# Patient Record
Sex: Female | Born: 1960 | Race: Black or African American | Hispanic: No | Smoking: Never smoker
Health system: Southern US, Community
[De-identification: ages and names within clinical notes are randomized; demographics above are authoritative.]

## PROBLEM LIST (undated history)

## (undated) DIAGNOSIS — E119 Type 2 diabetes mellitus without complications: Secondary | ICD-10-CM

## (undated) DIAGNOSIS — Z789 Other specified health status: Secondary | ICD-10-CM

## (undated) DIAGNOSIS — I10 Essential (primary) hypertension: Secondary | ICD-10-CM

## (undated) HISTORY — DX: Essential (primary) hypertension: I10

## (undated) HISTORY — DX: Type 2 diabetes mellitus without complications: E11.9

## (undated) HISTORY — PX: WISDOM TOOTH EXTRACTION: SHX21

## (undated) HISTORY — PX: DILATION AND CURETTAGE OF UTERUS: SHX78

---

## 1987-06-21 HISTORY — PX: TUBAL LIGATION: SHX77

## 1999-06-02 ENCOUNTER — Other Ambulatory Visit: Admission: RE | Admit: 1999-06-02 | Discharge: 1999-06-02 | Payer: Self-pay | Admitting: *Deleted

## 2000-06-28 ENCOUNTER — Other Ambulatory Visit: Admission: RE | Admit: 2000-06-28 | Discharge: 2000-06-28 | Payer: Self-pay | Admitting: *Deleted

## 2000-06-28 ENCOUNTER — Encounter: Admission: RE | Admit: 2000-06-28 | Discharge: 2000-06-28 | Payer: Self-pay | Admitting: *Deleted

## 2003-05-20 ENCOUNTER — Other Ambulatory Visit: Admission: RE | Admit: 2003-05-20 | Discharge: 2003-05-20 | Payer: Self-pay | Admitting: Family Medicine

## 2004-07-15 ENCOUNTER — Encounter: Admission: RE | Admit: 2004-07-15 | Discharge: 2004-07-15 | Payer: Self-pay | Admitting: Family Medicine

## 2005-03-22 ENCOUNTER — Other Ambulatory Visit: Admission: RE | Admit: 2005-03-22 | Discharge: 2005-03-22 | Payer: Self-pay | Admitting: Family Medicine

## 2006-01-16 ENCOUNTER — Ambulatory Visit: Payer: Self-pay | Admitting: Family Medicine

## 2006-01-23 ENCOUNTER — Ambulatory Visit: Payer: Self-pay | Admitting: Family Medicine

## 2006-01-23 ENCOUNTER — Other Ambulatory Visit: Admission: RE | Admit: 2006-01-23 | Discharge: 2006-01-23 | Payer: Self-pay | Admitting: Family Medicine

## 2006-01-31 ENCOUNTER — Encounter: Admission: RE | Admit: 2006-01-31 | Discharge: 2006-01-31 | Payer: Self-pay | Admitting: Family Medicine

## 2006-02-07 ENCOUNTER — Ambulatory Visit: Payer: Self-pay | Admitting: Family Medicine

## 2006-02-09 ENCOUNTER — Encounter: Admission: RE | Admit: 2006-02-09 | Discharge: 2006-02-09 | Payer: Self-pay | Admitting: Family Medicine

## 2006-04-04 ENCOUNTER — Ambulatory Visit: Payer: Self-pay | Admitting: Family Medicine

## 2006-06-23 ENCOUNTER — Ambulatory Visit (HOSPITAL_COMMUNITY): Admission: RE | Admit: 2006-06-23 | Discharge: 2006-06-23 | Payer: Self-pay | Admitting: Obstetrics and Gynecology

## 2006-06-23 ENCOUNTER — Encounter (INDEPENDENT_AMBULATORY_CARE_PROVIDER_SITE_OTHER): Payer: Self-pay | Admitting: Specialist

## 2008-04-03 IMAGING — MG MM SCREEN MAMMOGRAM BILATERAL
4 series · 4 of 4 positions shown · non-contrast
Comparison: none

DG SCREEN MAMMOGRAM BILATERAL
Bilateral CC and MLO view(s) were taken.

SCREENING MAMMOGRAM:
There is a fibroglandular pattern.  No masses or malignant type calcifications are identified.  
Compared with prior studies.

[R CC]
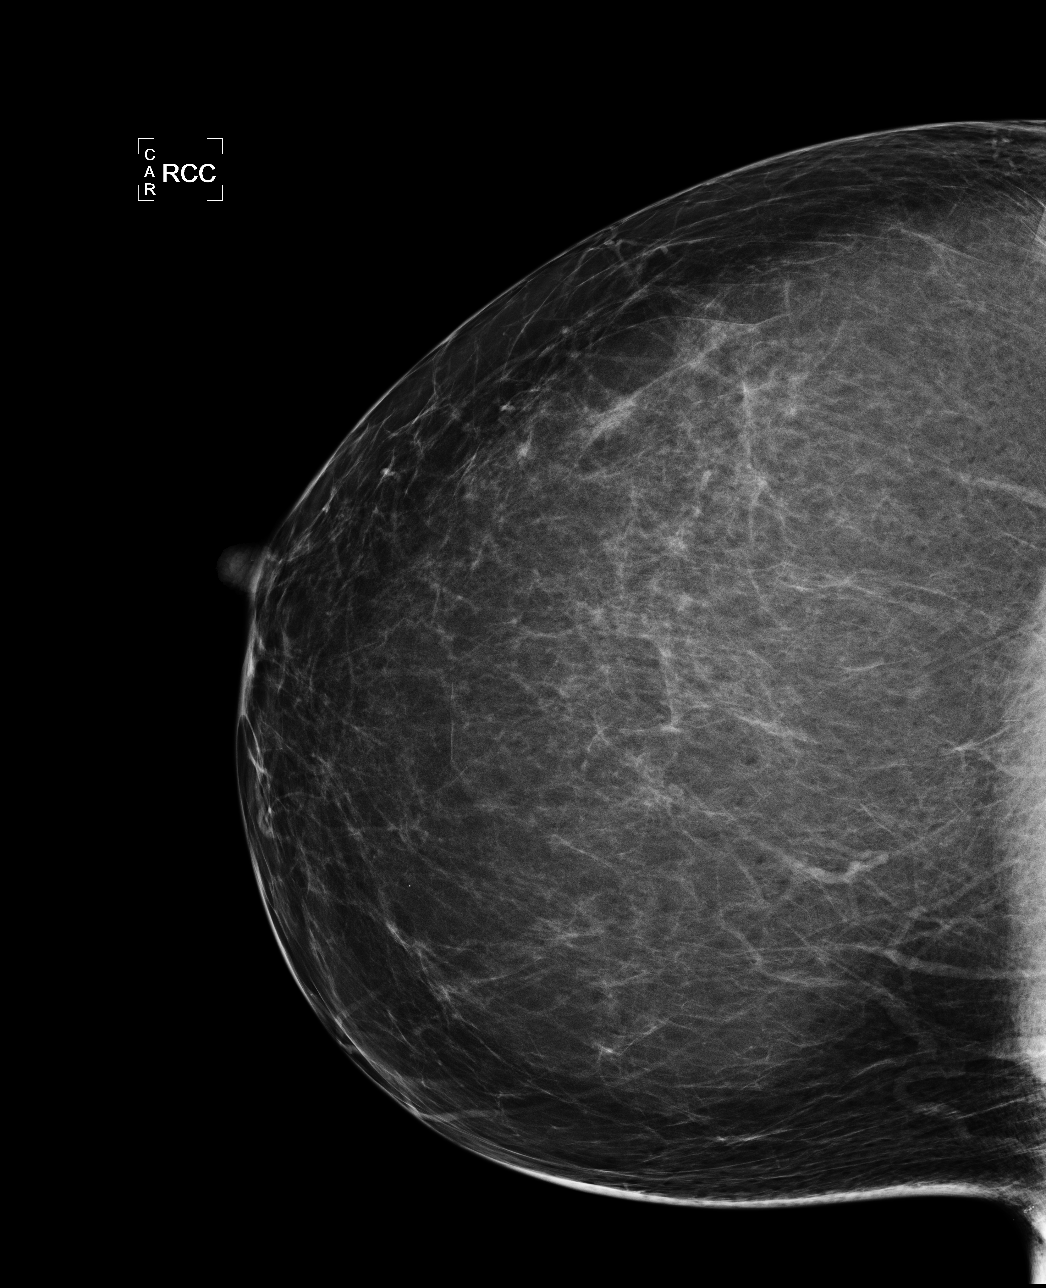

[L CC]
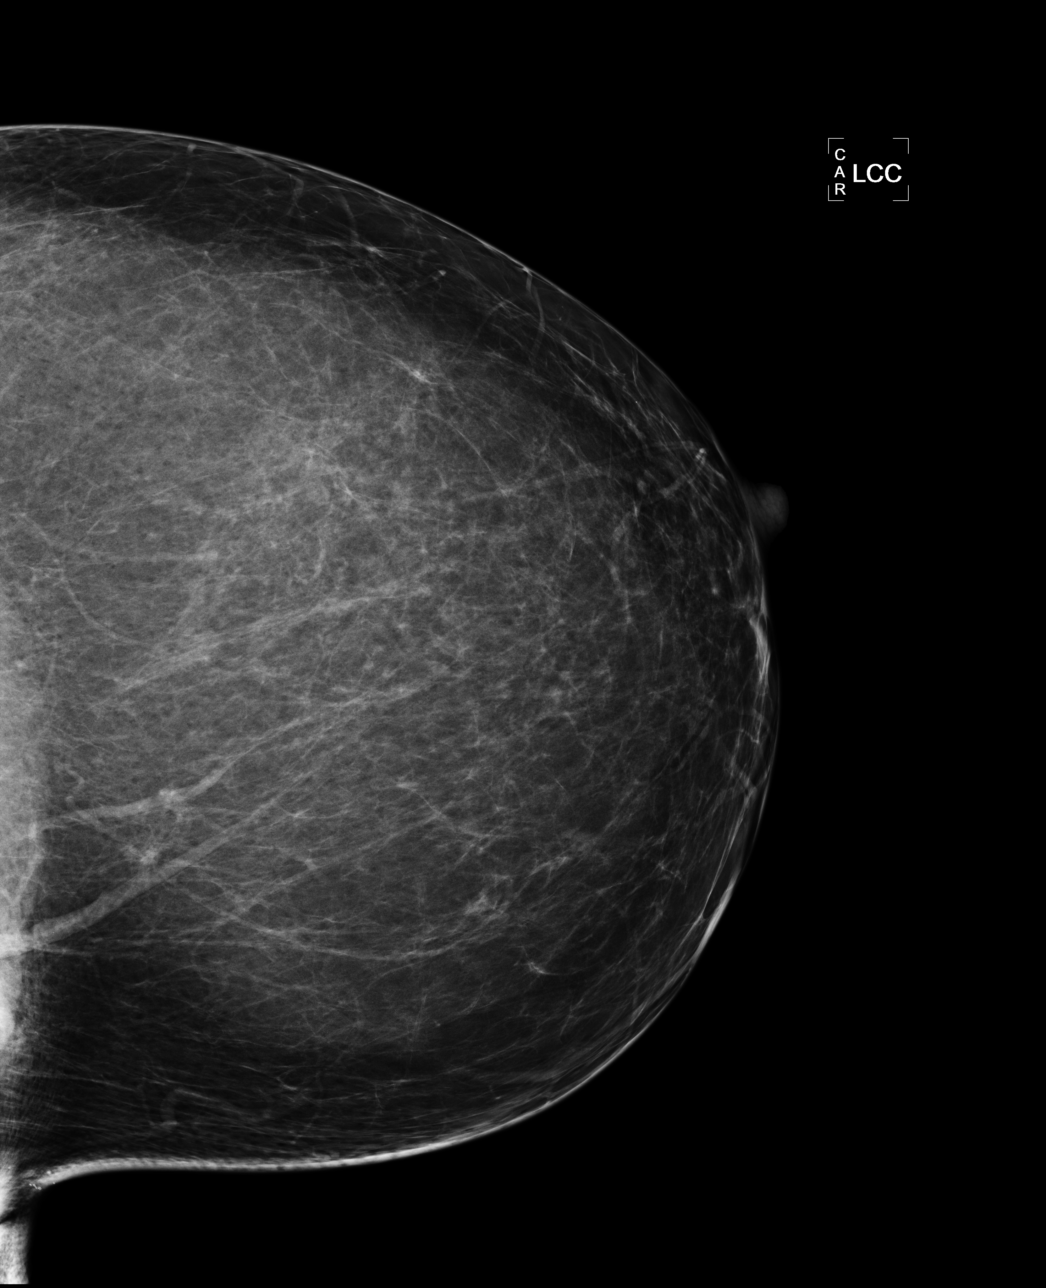

[L MLO]
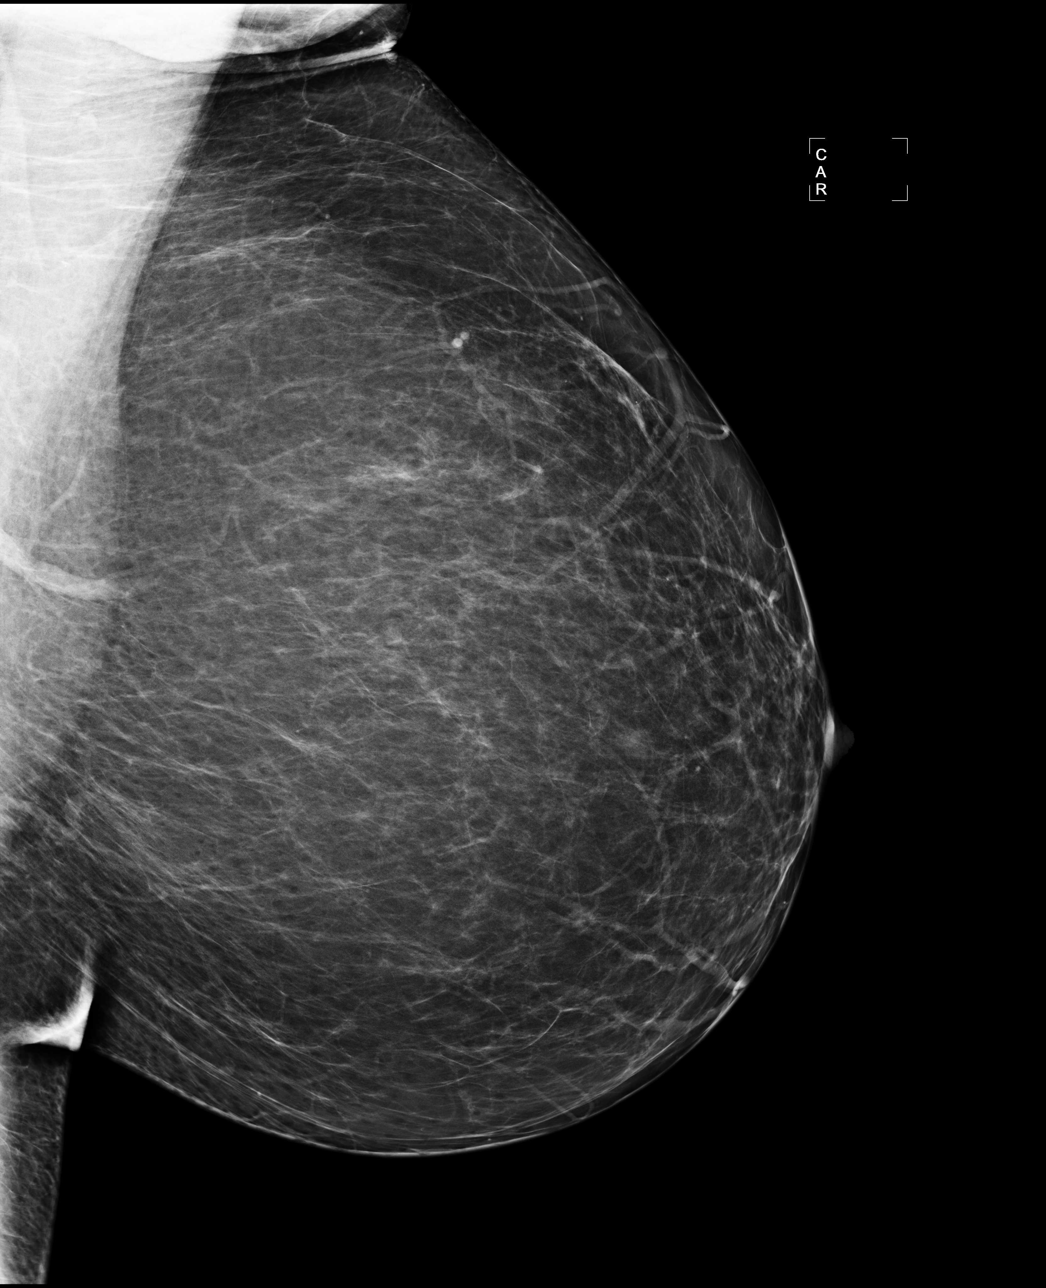

[R MLO]
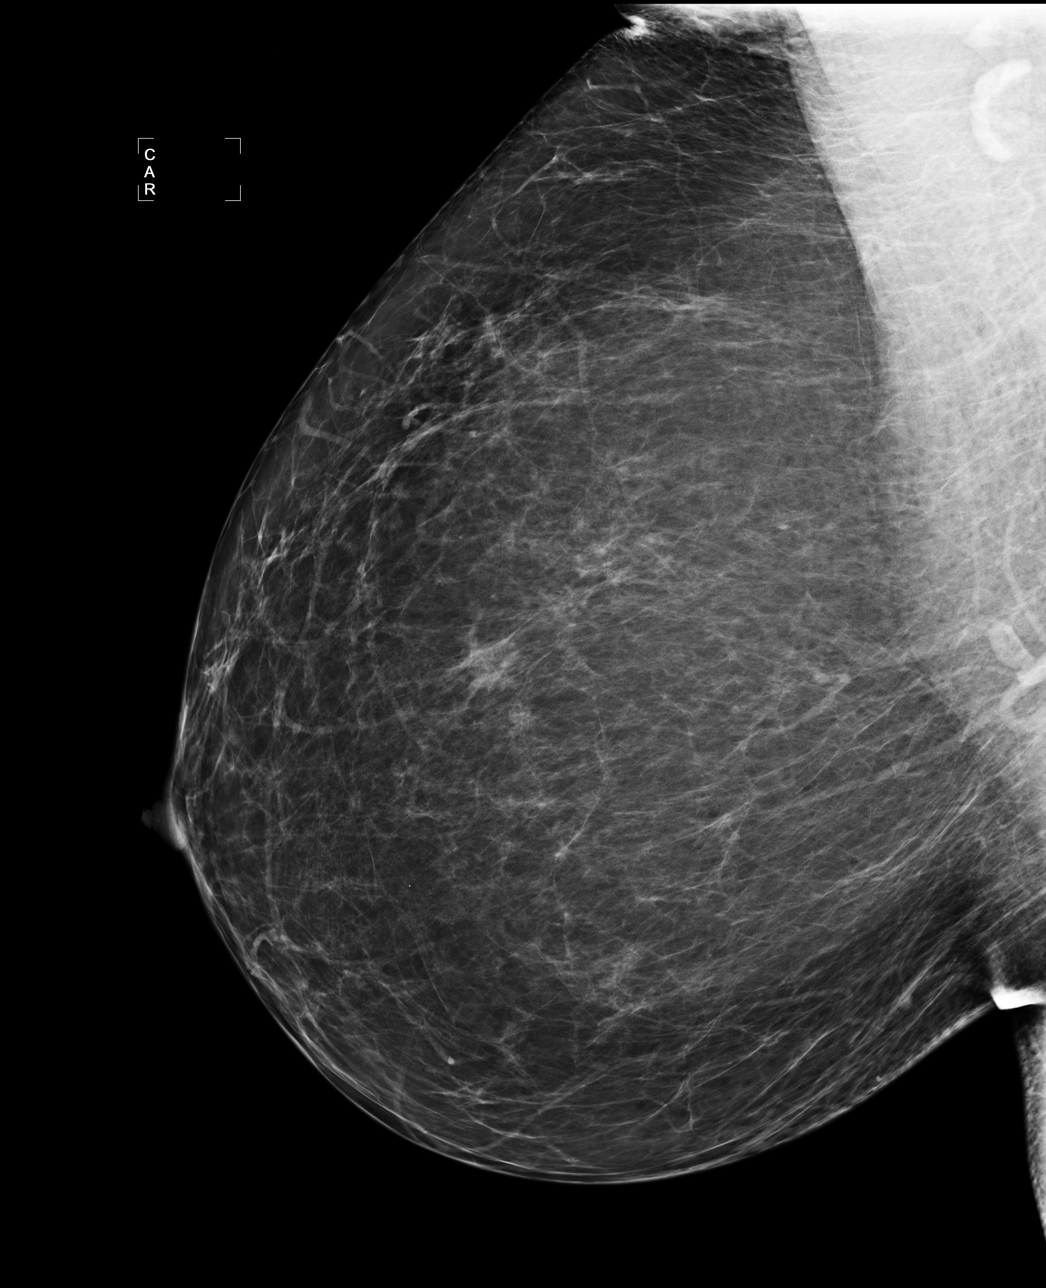

[4 of 4 positions shown; findings below may reference images not displayed]

IMPRESSION: No specific mammographic evidence of malignancy.  Next screening mammogram is recommended in one 
year.

ASSESSMENT: Negative - BI-RADS 1

Screening mammogram in 1 year.
ANALYZED BY COMPUTER AIDED DETECTION. , THIS PROCEDURE WAS A DIGITAL MAMMOGRAM.

## 2008-04-12 IMAGING — US US PELVIS COMPLETE
1 series · 13 of 25 positions shown · non-contrast
Comparison: None.

TRANSABDOMINAL AND TRANSVAGINAL PELVIC ULTRASOUND:

CLINICAL DATA: Cyst felt on physical exam. Abnormal Pap smear.
TECHNIQUE: Both transabdominal and transvaginal ultrasound examinations of the
pelvis were performed including evaluation of the uterus, ovaries, adnexal
regions, and pelvic cul-de-sac.

[Series 1: us pelvis complete · 0.35mm/px · 13 of 84 slices shown]
[im 1/84]
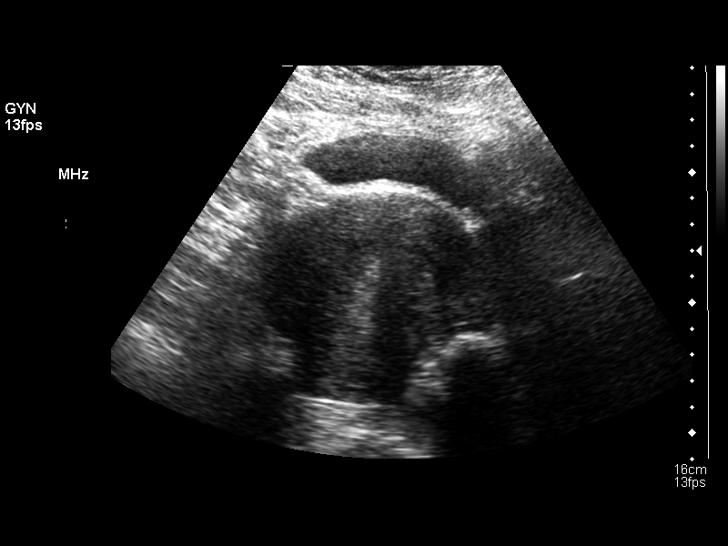
[im 7/84]
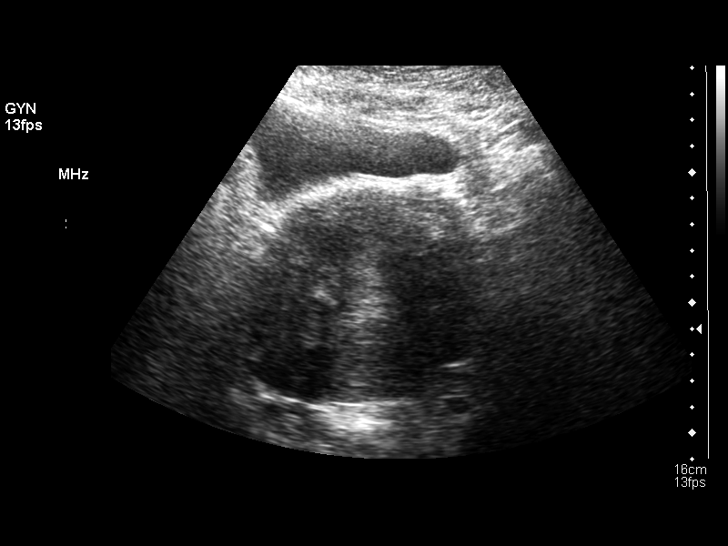
[im 14/84]
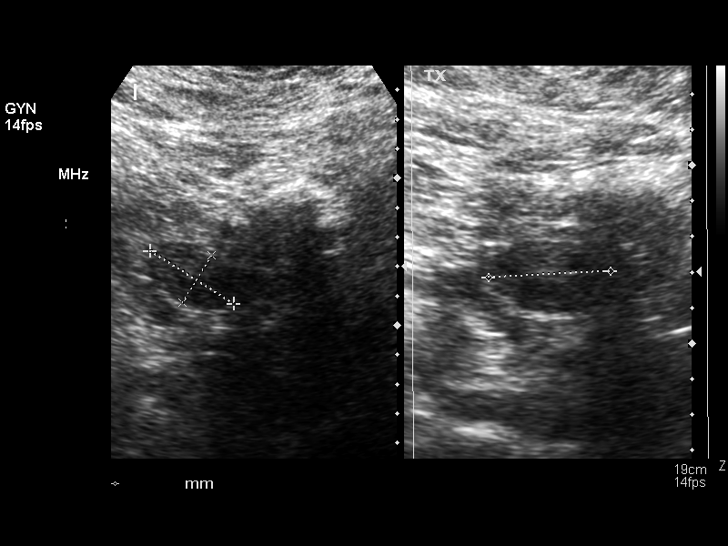
[im 21/84]
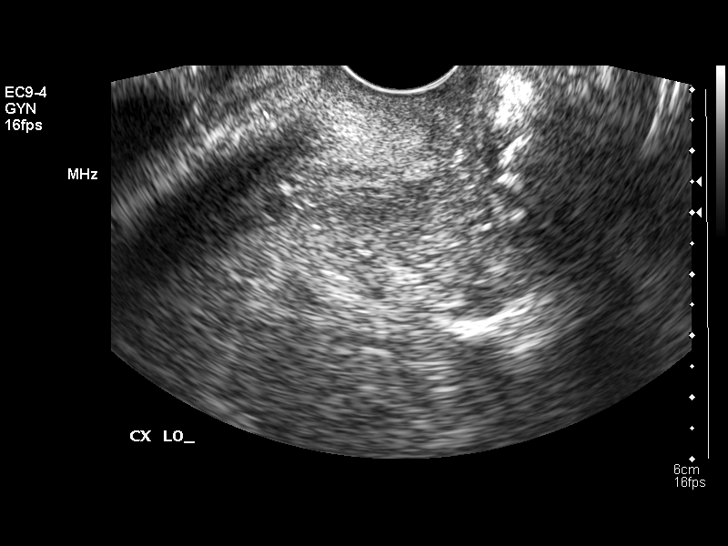
[im 28/84]
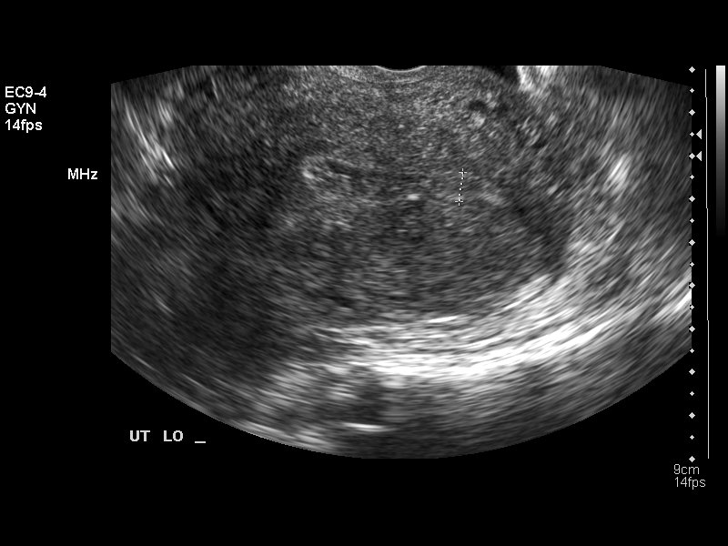
[im 35/84]
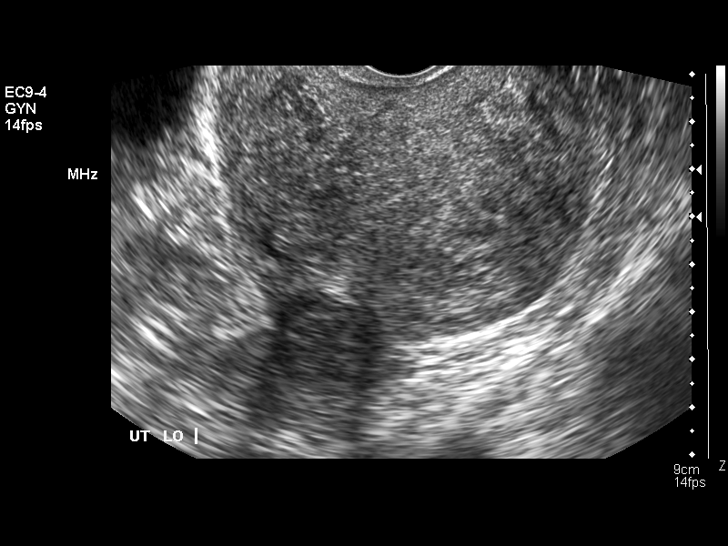
[im 42/84]
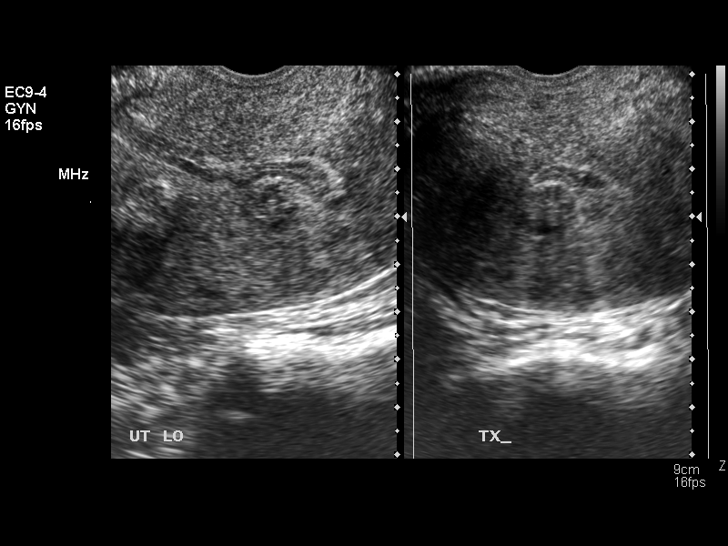
[im 49/84]
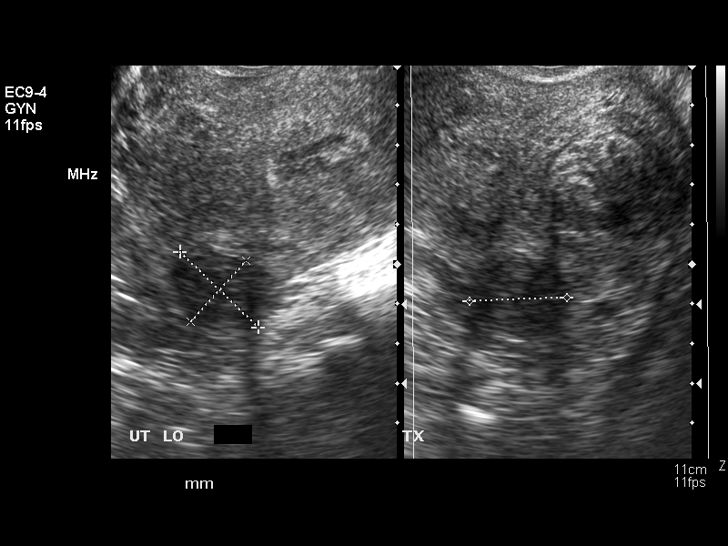
[im 56/84]
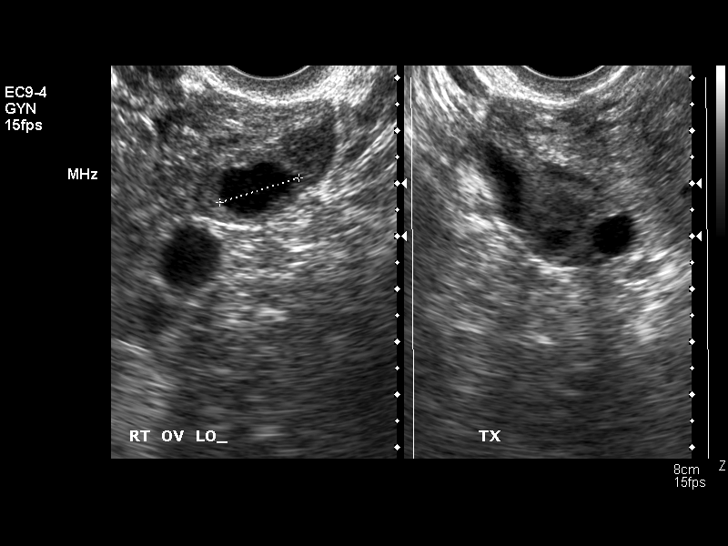
[im 63/84]
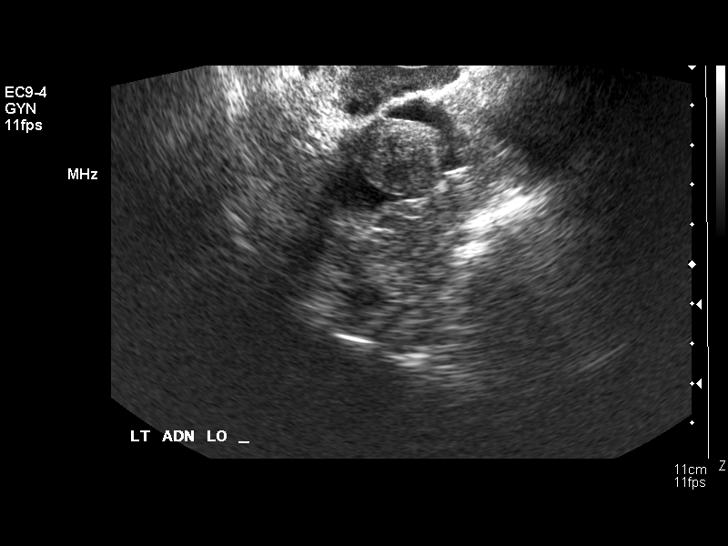
[im 70/84]
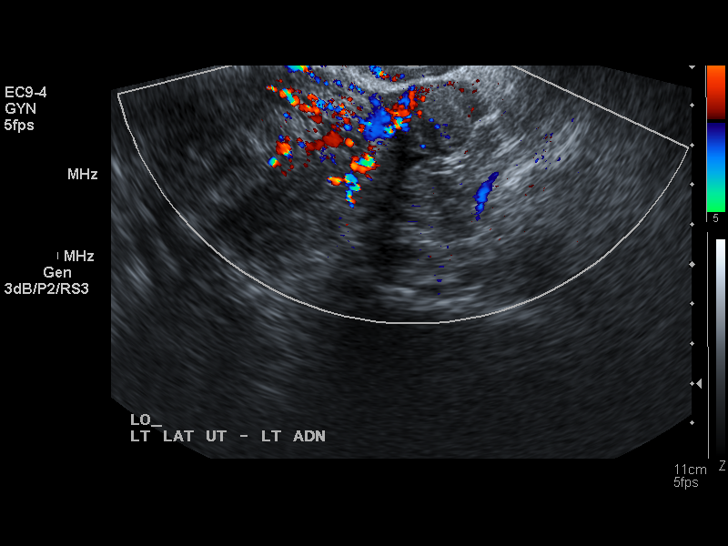
[im 77/84]
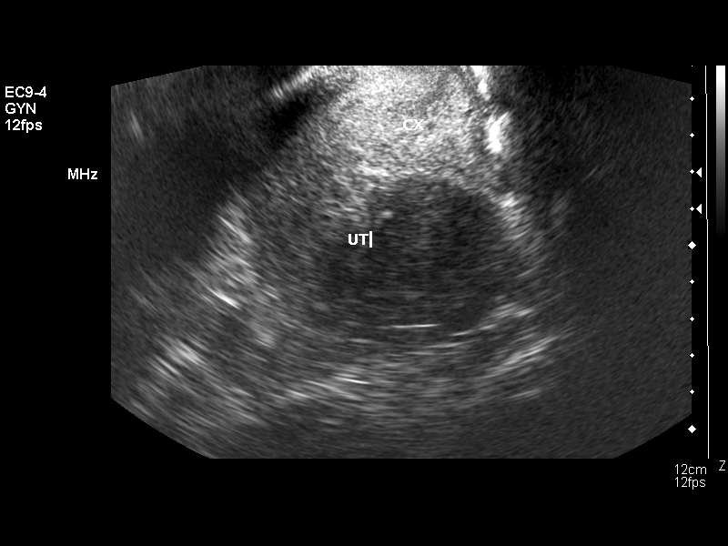
[im 84/84]
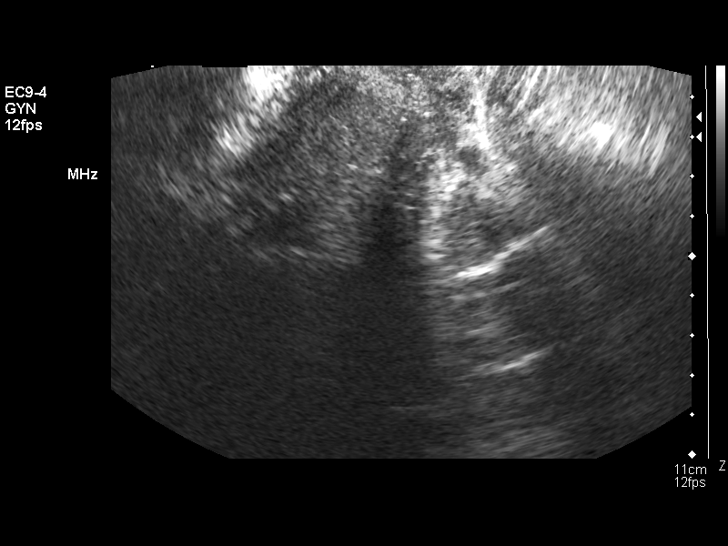

[13 of 25 positions shown; findings below may reference images not displayed]

FINDINGS: The uterus is retroflexed and measures approximately 20 x 5.3 x
cm. Two fibroids are identified, one in the left mid uterus is intramural and
measures 3.5 x 3.4 cm. A right subserosal fibroid measures 3.7 x 2.9 cm.
Endometrial stripe is 6-7 mm in thickness. A small focal area of increased
echogenicity is identified in the fundal endometrium and may represent a small
submucosal fibroid or endometrial polyp.

The right ovary measures 4.3 x 2. 1 x 2.4 cm. The left ovary is not well seen. A
2.3 x 2.0 cm noncystic area is identified in the left adnexal space which is
contiguous with the uterus. This may represent a small pedunculated fibroid
although bridging tissue between this area in the uterus itself is not well
demonstrated.
IMPRESSION: Multiple uterine fibroids. 6-7 mm endometrial stripe with a 1.1 cm submucosal
fibroid versus endometrial polyp. Sonohysterogram would likely prove helpful to
further evaluate.

Normal right ovary with nonvisualization of the left ovary. There is probably a
small pedunculated fibroid in the left adnexal space although the bridging
myometrial tissue is not well demonstrated. Consider ultrasound followup to
ensure stability.

## 2009-06-20 HISTORY — PX: COLONOSCOPY: SHX174

## 2010-02-03 ENCOUNTER — Ambulatory Visit (HOSPITAL_COMMUNITY): Admission: RE | Admit: 2010-02-03 | Discharge: 2010-02-03 | Payer: Self-pay | Admitting: Obstetrics and Gynecology

## 2010-09-03 LAB — CBC
HCT: 36.7 % (ref 36.0–46.0)
Hemoglobin: 12.4 g/dL (ref 12.0–15.0)
MCHC: 33.7 g/dL (ref 30.0–36.0)
MCV: 95.4 fL (ref 78.0–100.0)
RDW: 14 % (ref 11.5–15.5)

## 2010-11-05 NOTE — Op Note (Signed)
Susan, Riggs               ACCOUNT NO.:  [id number]   MEDICAL RECORD NO.:  [id number]          PATIENT TYPE:  AMB   LOCATION:  [facility]C                           FACILITY:  [facility]   PHYSICIAN:  [name], MD    DATE OF BIRTH:  [birth year]   DATE OF PROCEDURE:  06/23/2006  DATE OF DISCHARGE:                               OPERATIVE REPORT   PREOPERATIVE DIAGNOSIS:  Endometrial mass, suspect endometrial polyp.   POSTOPERATIVE DIAGNOSIS:  Endometrial mass, suspect endometrial polyp.   PROCEDURE:  Hysteroscopy D&C with polypectomy.   SURGEON:  Renaldo Fiddler.   ASSISTANT:  None.   ANESTHESIA:  General.   SPECIMEN:  Endometrial curettings and endometrial polyp.   ESTIMATED BLOOD LOSS:  Minimal.   COMPLICATIONS:  None.   SORBITOL DEFICIT:  80 mL.   CONDITION:  Stable and extubated to the recovery room.   PROCEDURE:  The patient was taken to the operating room where she was  placed under general anesthesia.  She was then positioned and dorsal  lithotomy using Allen stirrups.  She was prepped and draped in sterile  fashion, and a sterile catheter was used to empty her bladder for  approximately 25 mL of clear urine.  Due to uterine prolapse, a speculum  was not needed.  A single-tooth tenaculum was placed on the anterior lip  of the cervix, and a cervical block was done with 1% Xylocaine.  The  uterus then sounded to 9 cm and was easily dilated using Pratt dilators.  The hysteroscope was then inserted into the uterine cavity.  Her left  ostia was easily visualized.  The endometrial cavity was somewhat  atrophic.  The right ostia could not be visualized.  There was a large  endometrial polyp noted in the right fundal portion of the uterus.  The  hysteroscope was then removed, and polyp forceps were used to grasp and  remove the polyp.  A D&C was then performed.  The hysteroscope was then  reinserted to ensure that the polyp was removed.  The polyp could not be  located.  We could  still not locate the right ostia with the scope.  Due  to a high deficit at one point of 120 mL, the uterine cavity was  inspected for evidence of perforation.  This could not be  documented.  We then found fluid sitting within the sterile drape.  This  was drained into the suction cavity.  At the end of the case, our  deficit was 80 mL.  The patient tolerated the procedure well.  Sponge,  lap, needle and instrument counts were correct x2.  She was extubated  and taken to the recovery room in sterile condition.      [name], MD  Electronically Signed     GA/MEDQ  D:  06/23/2006  T:  06/23/2006  Job:  [id number]

## 2010-11-05 NOTE — H&P (Signed)
Susan Riggs               ACCOUNT NO.:  [id number]   MEDICAL RECORD NO.:  [id number]          PATIENT TYPE:  AMB   LOCATION:  SDC                           [facility]ACILITY:  [facility]   PHYSICIAN:  [name], MD    DATE OF BIRTH:  1960/11/05   DATE OF ADMISSION:  DATE OF DISCHARGE:                              HISTORY & PHYSICAL   HISTORY OF PRESENT ILLNESS:  The patient is a 50 year old African-  American female who first presented to the office in September to  discuss fibroids and a possible endometrial polyp.  A pelvic ultrasound  was ordered by her primary care physician, Dr. Susann Givens, after a possible  cyst was felt on pelvic exam. Ultrasound at [facility] showed a  left sided fibroid measuring 3.5 x 3.4 cm, a right subserosal fibroid as  well as a thickened endometrial stripe.  There was also an increase in  focal area of increased echogenicity within the endometrial cavity  possibly representing a polyp.  This was followed up with a  sonohysterogram which showed a 12 mm polypoid mass within the  endometrial cavity.   PAST MEDICAL HISTORY:  Negative.   PAST SURGICAL HISTORY:  Bilateral tubal ligation.   SOCIAL HISTORY:  Negative for tobacco, alcohol or drugs.   ALLERGIES:  PENICILLIN.   MEDICATIONS:  Multivitamins, iron.   OBSTETRICAL HISTORY:  Two full-term vaginal deliveries. GYN history is  negative for abnormal Pap smears.   FAMILY HISTORY:  Noncontributory.   PHYSICAL EXAMINATION:  VITAL SIGNS:  Height 5 feet, 6 inches, weight  224.  Blood pressure 124/80.  ABDOMEN:  Soft, obese, nontender.  No palpable masses.  PELVIC:  Exam reveals normal external female genitalia.  Vagina and  cervix are normal.  No lesions.  There is a small amount of prolapse.  Uterus is mobile and mildly tender, more so on the right.  The uterus is  enlarged consistent with fibroids.  There are no adnexal masses noted.   ASSESSMENT/PLAN:  This is a 50 year old African-American  female with a  fibroid uterus and endometrial polyps.  The patient elects to proceed  with hysteroscopy, D and C and polypectomy.  Informed consent was  obtained.      [name], MD  Electronically Signed    GA/MEDQ  D:  06/22/2006  T:  06/22/2006  Job:  [id number]

## 2011-03-15 NOTE — Patient Instructions (Addendum)
   Your procedure is scheduled on: Friday  Enter through the Main Entrance of [facility] at: 6am Pick up the phone at the desk and dial [phone] and inform us of your arrival  Please call this number if you have any problems the morning of surgery: [phone]  Remember: Do not eat food after midnight  Do not drink clear liquids after:midnight Take these medicines the morning of surgery with a SIP OF WATER:none  Do not wear jewelry, make-up, or FINGER nail polish Do not wear lotions, powders, or perfumes.  Do not shave 48 hours prior to surgery. Do not bring valuables to the hospital.  Patients discharged on the day of surgery will not be allowed to drive home.   Name and phone number of your driver: daughter Zigmund Daniel   Remember to use your hibiclens as instructed.Please shower with 1/2 bottle the evening before your surgery and the other 1/2 bottle the morning of surgery.

## 2011-03-18 ENCOUNTER — Encounter (HOSPITAL_COMMUNITY): Payer: Self-pay

## 2011-03-18 ENCOUNTER — Encounter (HOSPITAL_COMMUNITY)
Admission: RE | Admit: 2011-03-18 | Discharge: 2011-03-18 | Disposition: A | Discharge status: Home / Self Care | Payer: BC Managed Care – PPO | Source: Ambulatory Visit | Attending: Obstetrics and Gynecology | Admitting: Obstetrics and Gynecology

## 2011-03-18 HISTORY — DX: Other specified health status: Z78.9

## 2011-03-18 LAB — CBC
MCH: 27.8 pg (ref 26.0–34.0)
MCHC: 32.5 g/dL (ref 30.0–36.0)
MCV: 85.5 fL (ref 78.0–100.0)
Platelets: 206 10*3/uL (ref 150–400)

## 2011-03-24 NOTE — H&P (Addendum)
50 yo w/ menorrhagia and endometrial mass presents today for surgical mngt.    PMHx:  Neg PSHx:  H/s D&C 01/2010 All:  None Meds:  Integra, MTV FHx:  N/A  AF, VSS Gen - NAD CV - RRR Lungs - clear bilat Abd - NT PV - without masses  SHG - 3cm intracavitary mass.  Multiple uterine fibroids  A/P:  Menorrhagia - offered pt hysterectomy given h/o endometrial polyp in the past.  Pt declines and elects for hysteroscopy, D&C w/ resection of endometrial mass.  R/B/A d/w pt and informed consent obtained.  No changes, ga

## 2011-03-25 ENCOUNTER — Encounter (HOSPITAL_COMMUNITY): Payer: Self-pay | Admitting: *Deleted

## 2011-03-25 ENCOUNTER — Other Ambulatory Visit: Payer: Self-pay | Admitting: Obstetrics and Gynecology

## 2011-03-25 ENCOUNTER — Ambulatory Visit (HOSPITAL_COMMUNITY): Payer: BC Managed Care – PPO | Admitting: Anesthesiology

## 2011-03-25 ENCOUNTER — Encounter (HOSPITAL_COMMUNITY): Payer: Self-pay | Admitting: Anesthesiology

## 2011-03-25 ENCOUNTER — Ambulatory Visit (HOSPITAL_COMMUNITY)
Admission: RE | Admit: 2011-03-25 | Discharge: 2011-03-25 | Disposition: A | Discharge status: Home / Self Care | Payer: BC Managed Care – PPO | Source: Ambulatory Visit | Attending: Obstetrics and Gynecology | Admitting: Obstetrics and Gynecology

## 2011-03-25 ENCOUNTER — Encounter (HOSPITAL_COMMUNITY)
Admission: RE | Disposition: A | Discharge status: Home / Self Care | Payer: Self-pay | Source: Ambulatory Visit | Attending: Obstetrics and Gynecology

## 2011-03-25 DIAGNOSIS — Z01818 Encounter for other preprocedural examination: Secondary | ICD-10-CM | POA: Insufficient documentation

## 2011-03-25 DIAGNOSIS — N920 Excessive and frequent menstruation with regular cycle: Secondary | ICD-10-CM | POA: Insufficient documentation

## 2011-03-25 DIAGNOSIS — N840 Polyp of corpus uteri: Secondary | ICD-10-CM | POA: Insufficient documentation

## 2011-03-25 DIAGNOSIS — Z01812 Encounter for preprocedural laboratory examination: Secondary | ICD-10-CM | POA: Insufficient documentation

## 2011-03-25 HISTORY — PX: HYSTEROSCOPY WITH D & C: SHX1775

## 2011-03-25 SURGERY — DILATATION AND CURETTAGE /HYSTEROSCOPY
Anesthesia: Monitor Anesthesia Care | Site: Vagina | Wound class: Clean Contaminated

## 2011-03-25 MED ORDER — ACETAMINOPHEN 325 MG PO TABS: 325.0000 mg | ORAL_TABLET | ORAL | Status: DC | PRN

## 2011-03-25 MED ORDER — PROMETHAZINE HCL 25 MG/ML IJ SOLN: 6.2500 mg | INTRAMUSCULAR | Status: DC | PRN

## 2011-03-25 MED ORDER — FENTANYL CITRATE 0.05 MG/ML IJ SOLN
INTRAMUSCULAR | Status: AC
  Filled 2011-03-25: qty 2

## 2011-03-25 MED ORDER — DEXAMETHASONE SODIUM PHOSPHATE 4 MG/ML IJ SOLN
INTRAMUSCULAR | Status: DC | PRN
  Administered 2011-03-25: 10 mg via INTRAVENOUS

## 2011-03-25 MED ORDER — KETOROLAC TROMETHAMINE 30 MG/ML IJ SOLN
INTRAMUSCULAR | Status: DC | PRN
  Administered 2011-03-25: 30 mg via INTRAVENOUS

## 2011-03-25 MED ORDER — FENTANYL CITRATE 0.05 MG/ML IJ SOLN
INTRAMUSCULAR | Status: DC | PRN
  Administered 2011-03-25 (×4): 50 ug via INTRAVENOUS

## 2011-03-25 MED ORDER — MIDAZOLAM HCL 2 MG/2ML IJ SOLN
INTRAMUSCULAR | Status: AC
  Filled 2011-03-25: qty 2

## 2011-03-25 MED ORDER — LIDOCAINE HCL (CARDIAC) 20 MG/ML IV SOLN
INTRAVENOUS | Status: DC | PRN
  Administered 2011-03-25: 100 mg via INTRAVENOUS

## 2011-03-25 MED ORDER — GLYCOPYRROLATE 0.2 MG/ML IJ SOLN
INTRAMUSCULAR | Status: DC | PRN
  Administered 2011-03-25: .15 mg via INTRAVENOUS

## 2011-03-25 MED ORDER — GLYCINE 1.5 % IR SOLN
Status: DC | PRN
  Administered 2011-03-25: 3000 mL

## 2011-03-25 MED ORDER — DEXAMETHASONE SODIUM PHOSPHATE 10 MG/ML IJ SOLN
INTRAMUSCULAR | Status: AC
  Filled 2011-03-25: qty 1

## 2011-03-25 MED ORDER — LIDOCAINE HCL 1 % IJ SOLN
INTRAMUSCULAR | Status: DC | PRN
  Administered 2011-03-25: 10 mL

## 2011-03-25 MED ORDER — KETOROLAC TROMETHAMINE 30 MG/ML IJ SOLN: 15.0000 mg | Freq: Once | INTRAMUSCULAR | Status: DC | PRN

## 2011-03-25 MED ORDER — PROPOFOL 10 MG/ML IV EMUL
INTRAVENOUS | Status: DC | PRN
  Administered 2011-03-25: 200 mg via INTRAVENOUS

## 2011-03-25 MED ORDER — ONDANSETRON HCL 4 MG/2ML IJ SOLN
INTRAMUSCULAR | Status: AC
  Filled 2011-03-25: qty 2

## 2011-03-25 MED ORDER — ONDANSETRON HCL 4 MG/2ML IJ SOLN
INTRAMUSCULAR | Status: DC | PRN
  Administered 2011-03-25: 4 mg via INTRAVENOUS

## 2011-03-25 MED ORDER — FENTANYL CITRATE 0.05 MG/ML IJ SOLN: 25.0000 ug | INTRAMUSCULAR | Status: DC | PRN

## 2011-03-25 MED ORDER — PROPOFOL 10 MG/ML IV EMUL
INTRAVENOUS | Status: AC
  Filled 2011-03-25: qty 20

## 2011-03-25 MED ORDER — LACTATED RINGERS IV SOLN
INTRAVENOUS | Status: DC
  Administered 2011-03-25: 125 mL via INTRAVENOUS
  Administered 2011-03-25: 08:00:00 via INTRAVENOUS

## 2011-03-25 MED ORDER — HYDROCODONE-ACETAMINOPHEN 5-500 MG PO TABS: 1.0000 | ORAL_TABLET | Freq: Four times a day (QID) | ORAL | Status: AC | PRN

## 2011-03-25 MED ORDER — KETOROLAC TROMETHAMINE 30 MG/ML IJ SOLN
INTRAMUSCULAR | Status: AC
  Filled 2011-03-25: qty 1

## 2011-03-25 MED ORDER — GLYCOPYRROLATE 0.2 MG/ML IJ SOLN
INTRAMUSCULAR | Status: AC
  Filled 2011-03-25: qty 1

## 2011-03-25 MED ORDER — LIDOCAINE HCL (CARDIAC) 20 MG/ML IV SOLN
INTRAVENOUS | Status: AC
  Filled 2011-03-25: qty 5

## 2011-03-25 MED ORDER — MIDAZOLAM HCL 5 MG/5ML IJ SOLN
INTRAMUSCULAR | Status: DC | PRN
  Administered 2011-03-25: 2 mg via INTRAVENOUS

## 2011-03-25 SURGICAL SUPPLY — 18 items
ABLATOR ENDOMETRIAL BIPOLAR (ABLATOR) IMPLANT
CANISTER SUCTION 2500CC (MISCELLANEOUS) ×2 IMPLANT
CATH ROBINSON RED A/P 16FR (CATHETERS) ×2 IMPLANT
CATH THERMACHOICE III (CATHETERS) IMPLANT
CLOTH BEACON ORANGE TIMEOUT ST (SAFETY) ×2 IMPLANT
CONTAINER PREFILL 10% NBF 60ML (FORM) ×4 IMPLANT
DRAPE UTILITY XL STRL (DRAPES) ×2 IMPLANT
DRESSING TELFA 8X3 (GAUZE/BANDAGES/DRESSINGS) ×2 IMPLANT
ELECT REM PT RETURN 9FT ADLT (ELECTROSURGICAL) ×2
ELECTRODE REM PT RTRN 9FT ADLT (ELECTROSURGICAL) ×1 IMPLANT
GLOVE BIO SURGEON STRL SZ 6.5 (GLOVE) ×2 IMPLANT
GLOVE BIOGEL PI IND STRL 7.0 (GLOVE) ×1 IMPLANT
GLOVE BIOGEL PI INDICATOR 7.0 (GLOVE) ×1
GOWN PREVENTION PLUS LG XLONG (DISPOSABLE) ×4 IMPLANT
LOOP ANGLED CUTTING 22FR (CUTTING LOOP) IMPLANT
PACK HYSTEROSCOPY LF (CUSTOM PROCEDURE TRAY) ×2 IMPLANT
TOWEL OR 17X24 6PK STRL BLUE (TOWEL DISPOSABLE) ×4 IMPLANT
WATER STERILE IRR 1000ML POUR (IV SOLUTION) ×2 IMPLANT

## 2011-03-25 NOTE — OR Nursing (Signed)
Procedure was Dilatation& Currettage With Hysteroscopy; Polypectomy.

## 2011-03-25 NOTE — Preoperative (Signed)
Beta Blockers   Reason not to administer Beta Blockers:Not Applicable 

## 2011-03-25 NOTE — Transfer of Care (Signed)
Immediate Anesthesia Transfer of Care Note  Patient: Susan Riggs  Procedure(s) Performed:  DILATATION AND CURETTAGE (D&C) /HYSTEROSCOPY - Dilatation & Currettage; Hysteroscopy  With Polypectomy  Patient Location: PACU  Anesthesia Type: General  Level of Consciousness: awake, alert  and oriented  Airway & Oxygen Therapy: Patient Spontanous Breathing and Patient connected to nasal cannula oxygen  Post-op Assessment: Report given to PACU RN and Post -op Vital signs reviewed and stable  Post vital signs: Reviewed and stable  Complications: No apparent anesthesia complications

## 2011-03-25 NOTE — Op Note (Signed)
Susan Riggs, Susan Riggs               ACCOUNT NO.:  [id number]  MEDICAL RECORD NO.:  [id number]  LOCATION:  [facility]                          FACILITY:  [facility]  PHYSICIAN:  [name], MD    DATE OF BIRTH:  02-26-1961  DATE OF PROCEDURE: DATE OF DISCHARGE:                              OPERATIVE REPORT   PREOPERATIVE DIAGNOSES: 1. Menorrhagia 2. Endometrial mass.  POSTOPERATIVE DIAGNOSES: 1. Menorrhagia 2. Endometrial mass. 3. Path pending.  PROCEDURE: 1. Cervical block. 2. Hysteroscopy. 3. D and C. 4. Polypectomy.  SURGEON:  [name], MD  ANESTHESIA:  General.  SPECIMENS: 1. Endometrial curettings. 2. Endometrial polyps.  FLUID DEFICIT:  20 mL.  BLOOD LOSS:  Minimal.  COMPLICATIONS:  None.  CONDITION:  Stable to recovery room.  PROCEDURE:  The patient was taken to the operating room.  After informed consent was obtained, she was given general anesthesia, placed in the dorsal lithotomy position using Allen stirrups.  She was prepped and draped in sterile fashion.  An in-and-out catheter was used to drain her bladder.  Bivalve speculum was placed in the vagina and 1 mL of 1% lidocaine was injected at 12 o'clock on the cervix.  A single-tooth tenaculum was applied to the anterior lip of the cervix.  The remaining 9 mL of 1% lidocaine were then used to perform a cervical block.  The cervix was then serially dilated and the diagnostic hysteroscope was inserted.  A survey of the endometrial cavity revealed a large polypoid mass and a fluffy endometrial lining.  Hysteroscope was removed.  Polyp forceps were introduced into the cavity and the endometrial polyp was grasped and removed.  Endometrial polyp was placed on Telfa.  This was repeated until the bulk of the polyp had been removed which was confirmed by hysteroscopy.  A gentle curetting was then performed. Specimen was placed on Telfa and passed off labeled as endometrial curettings.  The hysteroscope was  then reinserted into the cavity.  No other polyps or masses were noted.  Tenaculum was removed from the cervix.  The cervix was noted to be hemostatic.  The speculum was removed.  The patient was taken to the recovery room in stable condition.  Sponge, lap, needle, and instrument counts were correct x2.     [name], MD     GA/MEDQ  D:  03/25/2011  T:  03/25/2011  Job:  [id number]

## 2011-03-25 NOTE — Anesthesia Postprocedure Evaluation (Signed)
  Anesthesia Post Note  Patient: Susan Riggs  Procedure(s) Performed:  DILATATION AND CURETTAGE (D&C) /HYSTEROSCOPY - Dilatation & Currettage; Hysteroscopy  With Polypectomy  Anesthesia type: GA  Patient location: PACU  Post pain: Pain level controlled  Post assessment: Post-op Vital signs reviewed  Last Vitals:  Filed Vitals:   03/25/11 0830  BP: 107/60  Pulse: 67  Temp:   Resp: 16    Post vital signs: Reviewed  Level of consciousness: sedated  Complications: No apparent anesthesia complications

## 2011-03-25 NOTE — Progress Notes (Signed)
03/25/2011  7:59 AM  PATIENT:  Susan Riggs  50 y.o. female  PRE-OPERATIVE DIAGNOSIS:  polyps; irregular bleeding  POST-OPERATIVE DIAGNOSIS:  polyps;irregular bleeding  PROCEDURE:  Procedure(s): DILATATION AND CURETTAGE (D&C)  HYSTEROSCOPY CERVICAL POLYPECTOMY Cervical block  SURGEON:  Surgeon(s): [name]   ANESTHESIA:   general  OR FLUID I/O:  Total I/O In: -  Out: 50 [Urine:50]  BLOOD ADMINISTERED:none  DRAINS: none   LOCAL MEDICATIONS USED:  LIDOCAINE 10CC  Fluid deficit:  20cc  SPECIMEN:  Source of Specimen:  1. EMC, 2. endometrial polyp  DISPOSITION OF SPECIMEN:  PATHOLOGY  COUNTS:  YES  TOURNIQUET:  * No tourniquets in log *  DICTATION: .Other Dictation: Dictation Number [id number]  PLAN OF CARE: Discharge to home after PACU  PATIENT DISPOSITION:  PACU - hemodynamically stable.   Delay start of Pharmacological VTE agent (>24hrs) due to surgical blood loss or risk of bleeding:  no

## 2011-03-25 NOTE — Anesthesia Preprocedure Evaluation (Signed)
Anesthesia Evaluation  Name, MR# and DOB Patient awake  General Assessment Comment  Reviewed: Allergy & Precautions, H&P , NPO status , Patient's Chart, lab work & pertinent test results, reviewed documented beta blocker date and time   History of Anesthesia Complications Negative for: history of anesthetic complications  Airway Mallampati: II TM Distance: >3 FB Neck ROM: full    Dental  (+) Teeth Intact   Pulmonary  clear to auscultation        Cardiovascular Exercise Tolerance: Good regular Normal    Neuro/Psych Negative Neurological ROS  Negative Psych ROS   GI/Hepatic negative GI ROS Neg liver ROS    Endo/Other  Negative Endocrine ROS  Renal/GU negative Renal ROS     Musculoskeletal   Abdominal   Peds  Hematology negative hematology ROS (+)   Anesthesia Other Findings   Reproductive/Obstetrics negative OB ROS                           Anesthesia Physical Anesthesia Plan  ASA: I  Anesthesia Plan: MAC and General   Post-op Pain Management:    Induction:   Airway Management Planned:   Additional Equipment:   Intra-op Plan:   Post-operative Plan:   Informed Consent: I have reviewed the patients History and Physical, chart, labs and discussed the procedure including the risks, benefits and alternatives for the proposed anesthesia with the patient or authorized representative who has indicated his/her understanding and acceptance.   Dental Advisory Given  Plan Discussed with: CRNA and Surgeon  Anesthesia Plan Comments:         Anesthesia Quick Evaluation

## 2011-03-28 ENCOUNTER — Encounter (HOSPITAL_COMMUNITY): Payer: Self-pay | Admitting: Obstetrics and Gynecology

## 2013-01-15 ENCOUNTER — Encounter (HOSPITAL_COMMUNITY): Payer: Self-pay | Admitting: *Deleted

## 2013-01-24 ENCOUNTER — Encounter (HOSPITAL_COMMUNITY): Payer: Self-pay | Admitting: Pharmacist

## 2013-02-02 NOTE — H&P (Addendum)
Monia Timmers Serviss is an 52 y.o. female presenting for definitive management of menorrhagia.  Pt has had hysteroscopy D&C without resolution of her symptoms.    Pertinent Gynecological History: Menses: monthly Bleeding: heavy Contraception: BTL DES exposure: none Blood transfusions: none Sexually transmitted diseases:none Previous GYN Procedures: see history Last mammogram: normal Last pap: normal OB History: G2, P2   Menstrual History: Menarche age LMP:    Past Medical History  Diagnosis Date  . No pertinent past medical history   . SVD (spontaneous vaginal delivery)     x 2    Past Surgical History  Procedure Laterality Date  . Colonoscopy  2011  . Tubal ligation  87  . Hysteroscopy w/d&c  03/25/2011    Procedure: DILATATION AND CURETTAGE (D&C) /HYSTEROSCOPY;  Surgeon: [name];  Location: [facility];  Service: Gynecology;  Laterality: N/A;  Dilatation & Currettage; Hysteroscopy  With Polypectomy  . Dilation and curettage of uterus      x 2  . Wisdom tooth extraction      History reviewed. No pertinent family history.  Social History:  reports that she has never smoked. She has never used smokeless tobacco. She reports that she does not drink alcohol or use illicit drugs.  Allergies:  Allergies  Allergen Reactions  . Penicillins Rash    No prescriptions prior to admission    ROS  Height 5\' 6"  (1.676 m), weight 98.431 kg (217 lb), last menstrual period 01/08/2011. Physical Exam Gen - NAD CV - RRR Lungs - clear Abd - soft, NT. Uterus palpable at suprapubic region PV - uterus mobile, NT 16wk size Ext - NT, no edema  [place] - enlarged, fibroid uterus.  No adnexal masses   No results found for this or any previous visit (from the past 24 hour(s)).  No results found.  Assessment/Plan: TAH/BSO Plan of care reviewed, r/b/a discussed.  Informed consent  Garrie Elenes 02/02/2013, 2:14 PM

## 2013-02-03 MED ORDER — DEXTROSE 5 % IV SOLN
2.0000 g | INTRAVENOUS | Status: AC
  Administered 2013-02-04: 2 g via INTRAVENOUS
  Filled 2013-02-03: qty 2

## 2013-02-04 ENCOUNTER — Encounter (HOSPITAL_COMMUNITY): Payer: Self-pay | Admitting: *Deleted

## 2013-02-04 ENCOUNTER — Inpatient Hospital Stay (HOSPITAL_COMMUNITY)
Admission: RE | Admit: 2013-02-04 | Discharge: 2013-02-06 | DRG: 359 | Disposition: A | Discharge status: Home / Self Care | Payer: BC Managed Care – PPO | Source: Ambulatory Visit | Attending: Obstetrics and Gynecology | Admitting: Obstetrics and Gynecology

## 2013-02-04 ENCOUNTER — Encounter (HOSPITAL_COMMUNITY): Payer: Self-pay | Admitting: Anesthesiology

## 2013-02-04 ENCOUNTER — Inpatient Hospital Stay (HOSPITAL_COMMUNITY): Payer: BC Managed Care – PPO | Admitting: Anesthesiology

## 2013-02-04 ENCOUNTER — Encounter (HOSPITAL_COMMUNITY)
Admission: RE | Disposition: A | Discharge status: Home / Self Care | Payer: Self-pay | Source: Ambulatory Visit | Attending: Obstetrics and Gynecology

## 2013-02-04 DIAGNOSIS — D252 Subserosal leiomyoma of uterus: Secondary | ICD-10-CM | POA: Diagnosis present

## 2013-02-04 DIAGNOSIS — N949 Unspecified condition associated with female genital organs and menstrual cycle: Secondary | ICD-10-CM | POA: Diagnosis present

## 2013-02-04 DIAGNOSIS — D251 Intramural leiomyoma of uterus: Secondary | ICD-10-CM | POA: Diagnosis present

## 2013-02-04 DIAGNOSIS — N8000 Endometriosis of the uterus, unspecified: Secondary | ICD-10-CM | POA: Diagnosis present

## 2013-02-04 DIAGNOSIS — N920 Excessive and frequent menstruation with regular cycle: Principal | ICD-10-CM | POA: Diagnosis present

## 2013-02-04 DIAGNOSIS — N841 Polyp of cervix uteri: Secondary | ICD-10-CM | POA: Diagnosis present

## 2013-02-04 DIAGNOSIS — N840 Polyp of corpus uteri: Secondary | ICD-10-CM | POA: Diagnosis present

## 2013-02-04 DIAGNOSIS — N83209 Unspecified ovarian cyst, unspecified side: Secondary | ICD-10-CM | POA: Diagnosis present

## 2013-02-04 HISTORY — PX: SALPINGOOPHORECTOMY: SHX82

## 2013-02-04 HISTORY — PX: ABDOMINAL HYSTERECTOMY: SHX81

## 2013-02-04 LAB — COMPREHENSIVE METABOLIC PANEL
Albumin: 3.7 g/dL (ref 3.5–5.2)
Alkaline Phosphatase: 65 U/L (ref 39–117)
BUN: 13 mg/dL (ref 6–23)
Creatinine, Ser: 0.76 mg/dL (ref 0.50–1.10)
GFR calc Af Amer: >90 mL/min (ref >90)
Glucose, Bld: 116 mg/dL — ABNORMAL HIGH (ref 70–99)
Potassium: 3.5 mEq/L (ref 3.5–5.1)
Total Protein: 6.7 g/dL (ref 6.0–8.3)

## 2013-02-04 LAB — CBC
HCT: 36.4 % (ref 36.0–46.0)
Hemoglobin: 12.3 g/dL (ref 12.0–15.0)
MCH: 29.6 pg (ref 26.0–34.0)
MCHC: 33.8 g/dL (ref 30.0–36.0)
MCV: 87.5 fL (ref 78.0–100.0)
RDW: 13.2 % (ref 11.5–15.5)

## 2013-02-04 SURGERY — HYSTERECTOMY, ABDOMINAL
Anesthesia: General | Site: Abdomen | Laterality: Bilateral | Wound class: Clean Contaminated

## 2013-02-04 MED ORDER — KETOROLAC TROMETHAMINE 30 MG/ML IJ SOLN: 15.0000 mg | Freq: Once | INTRAMUSCULAR | Status: DC | PRN

## 2013-02-04 MED ORDER — LIDOCAINE HCL (CARDIAC) 20 MG/ML IV SOLN
INTRAVENOUS | Status: DC | PRN
  Administered 2013-02-04: 80 mg via INTRAVENOUS

## 2013-02-04 MED ORDER — ROCURONIUM BROMIDE 100 MG/10ML IV SOLN
INTRAVENOUS | Status: DC | PRN
  Administered 2013-02-04: 5 mg via INTRAVENOUS
  Administered 2013-02-04: 65 mg via INTRAVENOUS

## 2013-02-04 MED ORDER — FENTANYL CITRATE 0.05 MG/ML IJ SOLN
INTRAMUSCULAR | Status: DC | PRN
  Administered 2013-02-04: 100 ug via INTRAVENOUS
  Administered 2013-02-04: 50 ug via INTRAVENOUS
  Administered 2013-02-04: 100 ug via INTRAVENOUS
  Administered 2013-02-04 (×2): 50 ug via INTRAVENOUS

## 2013-02-04 MED ORDER — GLYCOPYRROLATE 0.2 MG/ML IJ SOLN
INTRAMUSCULAR | Status: AC
  Filled 2013-02-04: qty 3

## 2013-02-04 MED ORDER — ONDANSETRON HCL 4 MG/2ML IJ SOLN: 4.0000 mg | Freq: Four times a day (QID) | INTRAMUSCULAR | Status: DC | PRN

## 2013-02-04 MED ORDER — ROCURONIUM BROMIDE 50 MG/5ML IV SOLN
INTRAVENOUS | Status: AC
  Filled 2013-02-04: qty 1

## 2013-02-04 MED ORDER — ONDANSETRON HCL 4 MG/2ML IJ SOLN: 4.0000 mg | Freq: Once | INTRAMUSCULAR | Status: DC | PRN

## 2013-02-04 MED ORDER — SODIUM CHLORIDE 0.9 % IJ SOLN: 9.0000 mL | INTRAMUSCULAR | Status: DC | PRN

## 2013-02-04 MED ORDER — MIDAZOLAM HCL 5 MG/5ML IJ SOLN
INTRAMUSCULAR | Status: DC | PRN
  Administered 2013-02-04: 2 mg via INTRAVENOUS

## 2013-02-04 MED ORDER — HYDROMORPHONE HCL PF 1 MG/ML IJ SOLN
INTRAMUSCULAR | Status: AC
  Filled 2013-02-04: qty 1

## 2013-02-04 MED ORDER — PRENATAL MULTIVITAMIN CH
1.0000 | ORAL_TABLET | Freq: Every day | ORAL | Status: DC
  Administered 2013-02-05 – 2013-02-06 (×2): 1 via ORAL
  Filled 2013-02-04 (×2): qty 1

## 2013-02-04 MED ORDER — HYDROMORPHONE 0.3 MG/ML IV SOLN
INTRAVENOUS | Status: DC
  Administered 2013-02-04: 11:00:00 via INTRAVENOUS
  Administered 2013-02-04: 1.19 mg via INTRAVENOUS
  Administered 2013-02-04: 0.6 mg via INTRAVENOUS
  Administered 2013-02-04: 1.39 mg via INTRAVENOUS
  Administered 2013-02-05: 0.799 mg via INTRAVENOUS
  Administered 2013-02-05: 0.599 mg via INTRAVENOUS
  Administered 2013-02-05: 0.2 mg via INTRAVENOUS
  Filled 2013-02-04: qty 25

## 2013-02-04 MED ORDER — PROPOFOL 10 MG/ML IV EMUL
INTRAVENOUS | Status: AC
  Filled 2013-02-04: qty 20

## 2013-02-04 MED ORDER — FENTANYL CITRATE 0.05 MG/ML IJ SOLN
INTRAMUSCULAR | Status: AC
  Filled 2013-02-04: qty 10

## 2013-02-04 MED ORDER — GLYCOPYRROLATE 0.2 MG/ML IJ SOLN
INTRAMUSCULAR | Status: DC | PRN
  Administered 2013-02-04: 0.6 mg via INTRAVENOUS

## 2013-02-04 MED ORDER — KETOROLAC TROMETHAMINE 30 MG/ML IJ SOLN
INTRAMUSCULAR | Status: DC | PRN
  Administered 2013-02-04: 30 mg via INTRAVENOUS

## 2013-02-04 MED ORDER — DIPHENHYDRAMINE HCL 12.5 MG/5ML PO ELIX: 12.5000 mg | ORAL_SOLUTION | Freq: Four times a day (QID) | ORAL | Status: DC | PRN

## 2013-02-04 MED ORDER — NEOSTIGMINE METHYLSULFATE 1 MG/ML IJ SOLN
INTRAMUSCULAR | Status: DC | PRN
  Administered 2013-02-04: 3 mg via INTRAVENOUS

## 2013-02-04 MED ORDER — KETOROLAC TROMETHAMINE 15 MG/ML IJ SOLN
15.0000 mg | Freq: Three times a day (TID) | INTRAMUSCULAR | Status: AC
  Administered 2013-02-04 – 2013-02-05 (×4): 15 mg via INTRAVENOUS
  Filled 2013-02-04 (×4): qty 1

## 2013-02-04 MED ORDER — MENTHOL 3 MG MT LOZG: 1.0000 | LOZENGE | OROMUCOSAL | Status: DC | PRN

## 2013-02-04 MED ORDER — LIDOCAINE HCL (CARDIAC) 20 MG/ML IV SOLN
INTRAVENOUS | Status: AC
  Filled 2013-02-04: qty 5

## 2013-02-04 MED ORDER — MEPERIDINE HCL 25 MG/ML IJ SOLN: 6.2500 mg | INTRAMUSCULAR | Status: DC | PRN

## 2013-02-04 MED ORDER — DEXAMETHASONE SODIUM PHOSPHATE 10 MG/ML IJ SOLN
INTRAMUSCULAR | Status: AC
  Filled 2013-02-04: qty 1

## 2013-02-04 MED ORDER — KETOROLAC TROMETHAMINE 30 MG/ML IJ SOLN
INTRAMUSCULAR | Status: AC
  Filled 2013-02-04: qty 1

## 2013-02-04 MED ORDER — NALOXONE HCL 0.4 MG/ML IJ SOLN: 0.4000 mg | INTRAMUSCULAR | Status: DC | PRN

## 2013-02-04 MED ORDER — HYDROMORPHONE HCL PF 1 MG/ML IJ SOLN
INTRAMUSCULAR | Status: DC | PRN
  Administered 2013-02-04 (×2): 0.5 mg via INTRAVENOUS

## 2013-02-04 MED ORDER — HYDROMORPHONE HCL PF 1 MG/ML IJ SOLN
0.2500 mg | INTRAMUSCULAR | Status: DC | PRN
  Administered 2013-02-04: 0.5 mg via INTRAVENOUS

## 2013-02-04 MED ORDER — KCL-LACTATED RINGERS-D5W 20 MEQ/L IV SOLN
INTRAVENOUS | Status: DC
  Administered 2013-02-04 – 2013-02-05 (×3): via INTRAVENOUS
  Filled 2013-02-04 (×7): qty 1000

## 2013-02-04 MED ORDER — ONDANSETRON HCL 4 MG PO TABS: 4.0000 mg | ORAL_TABLET | Freq: Four times a day (QID) | ORAL | Status: DC | PRN

## 2013-02-04 MED ORDER — DEXAMETHASONE SODIUM PHOSPHATE 10 MG/ML IJ SOLN
INTRAMUSCULAR | Status: DC | PRN
  Administered 2013-02-04: 10 mg via INTRAVENOUS

## 2013-02-04 MED ORDER — FENTANYL CITRATE 0.05 MG/ML IJ SOLN
INTRAMUSCULAR | Status: AC
  Filled 2013-02-04: qty 2

## 2013-02-04 MED ORDER — PROPOFOL 10 MG/ML IV BOLUS
INTRAVENOUS | Status: DC | PRN
  Administered 2013-02-04: 200 mg via INTRAVENOUS

## 2013-02-04 MED ORDER — ONDANSETRON HCL 4 MG/2ML IJ SOLN
INTRAMUSCULAR | Status: AC
  Filled 2013-02-04: qty 2

## 2013-02-04 MED ORDER — ONDANSETRON HCL 4 MG/2ML IJ SOLN
INTRAMUSCULAR | Status: DC | PRN
  Administered 2013-02-04: 4 mg via INTRAVENOUS

## 2013-02-04 MED ORDER — DIPHENHYDRAMINE HCL 50 MG/ML IJ SOLN: 12.5000 mg | Freq: Four times a day (QID) | INTRAMUSCULAR | Status: DC | PRN

## 2013-02-04 MED ORDER — MIDAZOLAM HCL 2 MG/2ML IJ SOLN
INTRAMUSCULAR | Status: AC
  Filled 2013-02-04: qty 2

## 2013-02-04 MED ORDER — OXYCODONE-ACETAMINOPHEN 5-325 MG PO TABS
1.0000 | ORAL_TABLET | ORAL | Status: DC | PRN
  Administered 2013-02-06 (×2): 2 via ORAL
  Filled 2013-02-04 (×2): qty 2

## 2013-02-04 MED ORDER — LACTATED RINGERS IV SOLN
INTRAVENOUS | Status: DC
  Administered 2013-02-04 (×4): via INTRAVENOUS

## 2013-02-04 MED ORDER — NEOSTIGMINE METHYLSULFATE 1 MG/ML IJ SOLN
INTRAMUSCULAR | Status: AC
  Filled 2013-02-04: qty 1

## 2013-02-04 SURGICAL SUPPLY — 31 items
CANISTER SUCTION 2500CC (MISCELLANEOUS) ×2 IMPLANT
CHLORAPREP W/TINT 26ML (MISCELLANEOUS) ×2 IMPLANT
CLOTH BEACON ORANGE TIMEOUT ST (SAFETY) ×2 IMPLANT
CONT PATH 16OZ SNAP LID 3702 (MISCELLANEOUS) ×2 IMPLANT
DECANTER SPIKE VIAL GLASS SM (MISCELLANEOUS) IMPLANT
DERMABOND ADVANCED (GAUZE/BANDAGES/DRESSINGS) ×1
DERMABOND ADVANCED .7 DNX12 (GAUZE/BANDAGES/DRESSINGS) ×1 IMPLANT
GAUZE SPONGE 4X4 16PLY XRAY LF (GAUZE/BANDAGES/DRESSINGS) IMPLANT
GLOVE BIO SURGEON STRL SZ 6.5 (GLOVE) ×2 IMPLANT
GLOVE BIOGEL PI IND STRL 7.0 (GLOVE) ×1 IMPLANT
GLOVE BIOGEL PI INDICATOR 7.0 (GLOVE) ×1
GOWN STRL REIN XL XLG (GOWN DISPOSABLE) ×6 IMPLANT
HEMOSTAT SURGICEL 4X8 (HEMOSTASIS) IMPLANT
NS IRRIG 1000ML POUR BTL (IV SOLUTION) ×2 IMPLANT
PACK ABDOMINAL GYN (CUSTOM PROCEDURE TRAY) ×2 IMPLANT
PAD OB MATERNITY 4.3X12.25 (PERSONAL CARE ITEMS) ×2 IMPLANT
PROTECTOR NERVE ULNAR (MISCELLANEOUS) ×4 IMPLANT
SPONGE LAP 18X18 X RAY DECT (DISPOSABLE) ×2 IMPLANT
STAPLER VISISTAT 35W (STAPLE) IMPLANT
SUT MNCRL 0 MO-4 VIOLET 18 CR (SUTURE) ×3 IMPLANT
SUT MON AB-0 CT1 36 (SUTURE) ×2 IMPLANT
SUT MONOCRYL 0 MO 4 18  CR/8 (SUTURE) ×3
SUT PDS AB 0 CTX 60 (SUTURE) ×2 IMPLANT
SUT PLAIN 2 0 XLH (SUTURE) IMPLANT
SUT VIC AB 3-0 X1 27 (SUTURE) IMPLANT
SUT VIC AB 4-0 KS 27 (SUTURE) ×2 IMPLANT
SUT VICRYL 0 TIES 12 18 (SUTURE) ×2 IMPLANT
SYR CONTROL 10ML LL (SYRINGE) IMPLANT
TOWEL OR 17X24 6PK STRL BLUE (TOWEL DISPOSABLE) ×4 IMPLANT
TRAY FOLEY CATH 14FR (SET/KITS/TRAYS/PACK) ×2 IMPLANT
WATER STERILE IRR 1000ML POUR (IV SOLUTION) IMPLANT

## 2013-02-04 NOTE — Transfer of Care (Signed)
Immediate Anesthesia Transfer of Care Note  Patient: Susan Riggs  Procedure(s) Performed: Procedure(s): HYSTERECTOMY ABDOMINAL (Bilateral) SALPINGO OOPHORECTOMY (Bilateral)  Patient Location: PACU  Anesthesia Type:General  Level of Consciousness: awake, alert , oriented and patient cooperative  Airway & Oxygen Therapy: Patient Spontanous Breathing and Patient connected to nasal cannula oxygen  Post-op Assessment: Report given to PACU RN and Post -op Vital signs reviewed and stable  Post vital signs: Reviewed and stable  Complications: No apparent anesthesia complications

## 2013-02-04 NOTE — Anesthesia Preprocedure Evaluation (Signed)
Anesthesia Evaluation  Patient identified by MRN, date of birth, ID band Patient awake    Reviewed: Allergy & Precautions, H&P , NPO status , Patient's Chart, lab work & pertinent test results, reviewed documented beta blocker date and time   History of Anesthesia Complications Negative for: history of anesthetic complications  Airway Mallampati: II TM Distance: >3 FB Neck ROM: full    Dental  (+) Teeth Intact   Pulmonary neg pulmonary ROS,  breath sounds clear to auscultation        Cardiovascular Exercise Tolerance: Good negative cardio ROS  Rhythm:regular Rate:Normal     Neuro/Psych negative neurological ROS  negative psych ROS   GI/Hepatic negative GI ROS, Neg liver ROS,   Endo/Other  negative endocrine ROS  Renal/GU negative Renal ROS     Musculoskeletal   Abdominal   Peds  Hematology negative hematology ROS (+)   Anesthesia Other Findings   Reproductive/Obstetrics negative OB ROS                           Anesthesia Physical  Anesthesia Plan  ASA: I  Anesthesia Plan: General   Post-op Pain Management:    Induction: Intravenous  Airway Management Planned: Oral ETT  Additional Equipment:   Intra-op Plan:   Post-operative Plan: Extubation in OR  Informed Consent: I have reviewed the patients History and Physical, chart, labs and discussed the procedure including the risks, benefits and alternatives for the proposed anesthesia with the patient or authorized representative who has indicated his/her understanding and acceptance.   Dental advisory given  Plan Discussed with: CRNA  Anesthesia Plan Comments:         Anesthesia Quick Evaluation

## 2013-02-04 NOTE — Anesthesia Postprocedure Evaluation (Signed)
Anesthesia Post Note  Patient: Susan Riggs  Procedure(s) Performed: Procedure(s) (LRB): HYSTERECTOMY ABDOMINAL (Bilateral) SALPINGO OOPHORECTOMY (Bilateral)  Anesthesia type: General  Patient location: PACU  Post pain: Pain level controlled  Post assessment: Post-op Vital signs reviewed  Last Vitals:  Filed Vitals:   02/04/13 0930  BP: 154/79  Pulse: 55  Temp:   Resp: 14    Post vital signs: Reviewed  Level of consciousness: sedated  Complications: No apparent anesthesia complications

## 2013-02-04 NOTE — Op Note (Signed)
Susan Riggs, Susan Riggs               ACCOUNT NO.:  [id number]  MEDICAL RECORD NO.:  [id number]  LOCATION:  9320                          FACILITY:  [facility]  PHYSICIAN:  [name], MD    DATE OF BIRTH:  10/13/1960  DATE OF PROCEDURE:  02/04/2013 DATE OF DISCHARGE:                              OPERATIVE REPORT   PREOPERATIVE DIAGNOSES: 1. Fibroids. 2. Menorrhagia.  POSTOPERATIVE DIAGNOSES: 1. Fibroids. 2. Menorrhagia.  PROCEDURE:  Total abdominal hysterectomy, bilateral salpingo- oophorectomy.  SURGEON:  [name], MD.  ASSISTANT:  Duke Salvia. Marcelle Overlie, MD.  BLOOD LOSS:  250 mL.  URINE OUTPUT:  Clear, Foley catheter in place.  ANESTHESIA:  General.  SPECIMENS:  Uterus, cervix, bilateral fallopian tubes and ovaries.  COMPLICATIONS:  None.  CONDITION:  Stable and extubated to recovery room.  PROCEDURE:  The patient was taken to the operating room.  After informed consent was obtained, she was given general anesthesia in the supine position.  She was prepped and draped in a sterile fashion and a Foley catheter was inserted sterilely.  A Pfannenstiel skin incision was made with a scalpel and carried down to the underlying fascia.  The fascia was incised in the midline, and this was extended laterally using curved Mayo scissors.  Kocher clamps were used to grasp the fascia, this was tented upwards and the underlying rectus muscles were dissected off using sharp and blunt dissection.  Peritoneum was then identified and entered sharply.  This was extended superiorly and inferiorly.  The Lenox Ahr retractor was then placed.  The patient was placed in Trendelenburg position and moist lap sponges were used to retract the bowel.  The uterus was noted to have multiple fibroids and was significantly enlarged.  It was grasped with 2 Kelly clamps.  Round ligaments were identified bilaterally and transected.  The bladder flap was then created anteriorly and dissected  off the lower uterine segment and the cervix.  The utero-ovarian and fallopian tube were then doubly clamped, transected, and suture ligated.  The uterine arteries were then skeletonized bilaterally, clamped with Heaney clamps, transected, and suture ligated.  Hemostasis was assured.  The cardinal ligaments and uterosacral ligaments were then grasped, transected and suture ligated. Hemostasis was assured bilaterally.  The uterus was then amputated from the vagina.  The vaginal cuff was closed using a series of figure-of- eight stitches of Monocryl.  Our attention could then be directed into the bilateral tubes and ovaries.  The left fallopian tube and ovary were then grasped with the Babcock clamp.  Infundibulopelvic ligament was clamped with a curved Haney clamp, and the fallopian tube and ovary were transected using curved Mayo scissors.  The ligament was doubly suture ligated.  The specimen was passed off to be sent to pathology with the uterus.  The procedure was then repeated on the right.  Excellent hemostasis was noted throughout.  The pelvis was then copiously irrigated with warm normal saline.  All pedicles were reinspected and again found to be hemostatic. Retractors and lap sponges were then removed from the abdomen.  The peritoneum was reapproximated with a running stitch of Vicryl.  The fascia was closed with a looped 0 PDS.  The skin was closed with 4-0 Vicryl.  Dermabond was placed over the incision.  The patient was then extubated and taken to the recovery room in stable condition.  Sponge, lap, needle, and instrument counts were correct x2.     [name], MD     GA/MEDQ  D:  02/04/2013  T:  02/04/2013  Job:  [id number]

## 2013-02-04 NOTE — Anesthesia Postprocedure Evaluation (Signed)
  Anesthesia Post-op Note  Patient: Susan Riggs  Procedure(s) Performed: Procedure(s): HYSTERECTOMY ABDOMINAL (Bilateral) SALPINGO OOPHORECTOMY (Bilateral)  Patient Location: [facility]  Anesthesia Type:General  Level of Consciousness: awake, alert  and sedated  Airway and Oxygen Therapy: Patient Spontanous Breathing and Patient connected to nasal cannula oxygen  Post-op Pain: mild  Post-op Assessment: Patient's Cardiovascular Status Stable, Respiratory Function Stable, Patent Airway and No signs of Nausea or vomiting  Post-op Vital Signs: Reviewed and stable  Complications: No apparent anesthesia complications

## 2013-02-04 NOTE — Progress Notes (Signed)
Day of Surgery Procedure(s) (LRB): HYSTERECTOMY ABDOMINAL (Bilateral) SALPINGO OOPHORECTOMY (Bilateral)  Subjective: Patient reports incisional pain.    Objective: I have reviewed patient's vital signs, intake and output, medications and labs.  General: alert and cooperative GI: normal findings: soft, non-tender Extremities: extremities normal, atraumatic, no cyanosis or edema Vaginal Bleeding: none  Assessment: s/p Procedure(s): HYSTERECTOMY ABDOMINAL (Bilateral) SALPINGO OOPHORECTOMY (Bilateral): stable  Plan: continue postop care IVPCA, toradol for pain  LOS: 0 days    Susan Riggs 02/04/2013, 1:25 PM

## 2013-02-05 ENCOUNTER — Encounter (HOSPITAL_COMMUNITY): Payer: Self-pay | Admitting: Obstetrics and Gynecology

## 2013-02-05 LAB — CBC
HCT: 34.5 % — ABNORMAL LOW (ref 36.0–46.0)
Hemoglobin: 11.7 g/dL — ABNORMAL LOW (ref 12.0–15.0)
MCH: 30.2 pg (ref 26.0–34.0)
MCHC: 33.9 g/dL (ref 30.0–36.0)
RBC: 3.87 MIL/uL (ref 3.87–5.11)

## 2013-02-05 NOTE — Progress Notes (Signed)
1 Day Post-Op Procedure(s) (LRB): HYSTERECTOMY ABDOMINAL (Bilateral) SALPINGO OOPHORECTOMY (Bilateral)  Subjective: Patient reports tolerating PO.  Foley out, hasn't voided yet.  Pain well controlled  Objective: I have reviewed patient's vital signs, intake and output, medications and labs.  General: alert and cooperative Cardio: regular rate and rhythm GI: normal findings: soft, non-tender Extremities: extremities normal, atraumatic, no cyanosis or edema Vaginal Bleeding: none  Assessment: s/p Procedure(s): HYSTERECTOMY ABDOMINAL (Bilateral) SALPINGO OOPHORECTOMY (Bilateral): progressing well  Plan: Advance diet Encourage ambulation Advance to PO medication Discontinue IV fluids  LOS: 1 day    Susan Riggs 02/05/2013, 9:05 AM

## 2013-02-05 NOTE — Progress Notes (Signed)
Ur chart review completed.  

## 2013-02-06 MED ORDER — OXYCODONE-ACETAMINOPHEN 5-325 MG PO TABS: 1.0000 | ORAL_TABLET | ORAL | Status: DC | PRN

## 2013-02-06 MED ORDER — IBUPROFEN 200 MG PO TABS: 800.0000 mg | ORAL_TABLET | Freq: Three times a day (TID) | ORAL | Status: DC | PRN

## 2013-02-06 NOTE — Progress Notes (Signed)
2 Days Post-Op Procedure(s) (LRB): HYSTERECTOMY ABDOMINAL (Bilateral) SALPINGO OOPHORECTOMY (Bilateral)  Subjective: Patient reports incisional pain.   Has not taken any pain medications since PCA discontinued.  No CP or SOB.  No n/v.    Objective: I have reviewed patient's vital signs, intake and output, medications and labs.  General: alert and cooperative GI: normal findings: soft, appropriately tender, ND and incision: clean, dry and intact Extremities: extremities normal, atraumatic, no cyanosis or edema Vaginal Bleeding: none  Assessment: s/p Procedure(s): HYSTERECTOMY ABDOMINAL (Bilateral) SALPINGO OOPHORECTOMY (Bilateral): stable and progressing well  Plan: Discharge home Will offer pain medications this am.  If able to control pain with PO meds, plan for discharge  LOS: 2 days    Cordelro Gautreau 02/06/2013, 8:38 AM

## 2013-02-06 NOTE — Discharge Summary (Signed)
Physician Discharge Summary  Patient ID: LECIA ESPERANZA MRN: [id number] DOB/AGE: 1961/06/02 52 y.o.  Admit date: 02/04/2013 Discharge date: 02/06/2013  Admission Diagnoses:  Fibroids  Discharge Diagnoses:  Active Problems:   * No active hospital problems. *   Discharged Condition: stable  Hospital Course: Pt admitted postoperatively for postop care.  Pain was initially controlled with IV PCA.  Foley catheter was discontinued when ambulating and she was able to urinate without difficulty.  Once tolerating PO, IV PCA was stopped and PO meds given prn.  POD 2, pain controlled and pt was ambulating and urinating without difficulty.  Consults: None  Significant Diagnostic Studies: labs: CBC  Treatments: IV hydration and surgery: TAH/BSO  Discharge Exam: Blood pressure 136/60, pulse 67, temperature 98.7 F (37.1 C), temperature source Oral, resp. rate 18, height 5\' 6"  (1.676 m), weight 98.431 kg (217 lb), last menstrual period 01/08/2011, SpO2 98.00%. see daily PN  Disposition: 01-Home or Self Care     Medication List    STOP taking these medications       prenatal multivitamin Tabs tablet      TAKE these medications       ibuprofen 200 MG tablet  Commonly known as:  ADVIL  Take 4 tablets (800 mg total) by mouth every 8 (eight) hours as needed for pain.     oxyCODONE-acetaminophen 5-325 MG per tablet  Commonly known as:  PERCOCET/ROXICET  Take 1-2 tablets by mouth every 4 (four) hours as needed.           Follow-up Information   Schedule an appointment as soon as possible for a visit in 2 weeks to follow up.      Signed: Pollyann Roa 02/06/2013, 8:42 AM

## 2019-08-17 ENCOUNTER — Ambulatory Visit: Payer: BC Managed Care – PPO | Attending: Internal Medicine

## 2019-08-17 DIAGNOSIS — Z23 Encounter for immunization: Secondary | ICD-10-CM

## 2019-08-17 NOTE — Progress Notes (Signed)
   Covid-19 Vaccination Clinic  Name:  HAILEE KEITT    MRN: [id number] DOB: August 24, 1960  08/17/2019  Ms. Pikus was observed post Covid-19 immunization for 15 minutes without incidence. She was provided with Vaccine Information Sheet and instruction to access the V-Safe system.   Ms. Siv was instructed to call 911 with any severe reactions post vaccine: Marland Kitchen Difficulty breathing  . Swelling of your face and throat  . A fast heartbeat  . A bad rash all over your body  . Dizziness and weakness    Immunizations Administered    Name Date Dose VIS Date Route   Pfizer COVID-19 Vaccine 08/17/2019  6:31 PM 0.3 mL 05/31/2019 Intramuscular   Manufacturer: [place]   Lot: [id number]   [place]: [id number]

## 2019-09-07 ENCOUNTER — Ambulatory Visit: Payer: BC Managed Care – PPO | Attending: Internal Medicine

## 2019-09-07 DIAGNOSIS — Z23 Encounter for immunization: Secondary | ICD-10-CM

## 2019-09-07 NOTE — Progress Notes (Signed)
   Covid-19 Vaccination Clinic  Name:  MALIHA SADO    MRN: [id number] DOB: 07-Mar-1961  09/07/2019  Ms. Ow was observed post Covid-19 immunization for 15 minutes without incident. She was provided with Vaccine Information Sheet and instruction to access the V-Safe system.   Ms. Stankiewicz was instructed to call 911 with any severe reactions post vaccine: Marland Kitchen Difficulty breathing  . Swelling of face and throat  . A fast heartbeat  . A bad rash all over body  . Dizziness and weakness   Immunizations Administered    Name Date Dose VIS Date Route   Pfizer COVID-19 Vaccine 09/07/2019 11:41 AM 0.3 mL 05/31/2019 Intramuscular   Manufacturer: [place]   Lot: [id number]   [place]: [id number]

## 2021-12-14 ENCOUNTER — Ambulatory Visit (HOSPITAL_COMMUNITY)
Admission: EM | Admit: 2021-12-14 | Discharge: 2021-12-14 | Disposition: A | Discharge status: Home / Self Care | Payer: BC Managed Care – PPO | Attending: Emergency Medicine | Admitting: Emergency Medicine

## 2021-12-14 ENCOUNTER — Encounter (HOSPITAL_COMMUNITY): Payer: Self-pay | Admitting: Emergency Medicine

## 2021-12-14 ENCOUNTER — Ambulatory Visit (INDEPENDENT_AMBULATORY_CARE_PROVIDER_SITE_OTHER): Payer: BC Managed Care – PPO

## 2021-12-14 DIAGNOSIS — M79605 Pain in left leg: Secondary | ICD-10-CM

## 2021-12-14 DIAGNOSIS — R102 Pelvic and perineal pain: Secondary | ICD-10-CM

## 2021-12-14 DIAGNOSIS — M25552 Pain in left hip: Secondary | ICD-10-CM | POA: Diagnosis not present

## 2021-12-14 MED ORDER — IBUPROFEN 800 MG PO TABS
ORAL_TABLET | ORAL | Status: AC
  Filled 2021-12-14: qty 1

## 2021-12-14 MED ORDER — IBUPROFEN 800 MG PO TABS
800.0000 mg | ORAL_TABLET | Freq: Once | ORAL | Status: AC
  Administered 2021-12-14: 800 mg via ORAL

## 2021-12-14 NOTE — ED Provider Notes (Signed)
[facility]    CSN: [id number] Arrival date & time: 12/14/21  1714     History   Chief Complaint Chief Complaint  Patient presents with   Hip Pain    HPI Susan Riggs is a 61 y.o. female.  Presents with 1 week history of left hip and thigh pain.  Denies any injury or trauma to the area.  She notices pain after walking through the lateral left thigh, through the hip, sometimes radiates into the groin  Has tried ibuprofen and Tylenol that help relieve pain until they wear off. Last ibuprofen dose early this morning. No history of hip problems or osteoarthritis. Denies any fever, recent illness, back pain, falls.  Denies rash.  Past Medical History:  Diagnosis Date   No pertinent past medical history    SVD (spontaneous vaginal delivery)    x 2    There are no problems to display for this patient.   Past Surgical History:  Procedure Laterality Date   ABDOMINAL HYSTERECTOMY Bilateral 02/04/2013   Procedure: HYSTERECTOMY ABDOMINAL;  Surgeon: [name], MD;  Location: [facility];  Service: Gynecology;  Laterality: Bilateral;   COLONOSCOPY  2011   DILATION AND CURETTAGE OF UTERUS     x 2   HYSTEROSCOPY WITH D & C  03/25/2011   Procedure: DILATATION AND CURETTAGE (D&C) /HYSTEROSCOPY;  Surgeon: [name];  Location: [facility];  Service: Gynecology;  Laterality: N/A;  Dilatation & Currettage; Hysteroscopy  With Polypectomy   SALPINGOOPHORECTOMY Bilateral 02/04/2013   Procedure: SALPINGO OOPHORECTOMY;  Surgeon: [name], MD;  Location: [facility];  Service: Gynecology;  Laterality: Bilateral;   TUBAL LIGATION  89   WISDOM TOOTH EXTRACTION      OB History   No obstetric history on file.      Home Medications    Prior to Admission medications   Medication Sig Start Date End Date Taking? Authorizing Provider  ibuprofen (ADVIL) 200 MG tablet Take 4 tablets (800 mg total) by mouth every 8 (eight) hours as needed for pain. 02/06/13  Yes [name], MD   oxyCODONE-acetaminophen (PERCOCET/ROXICET) 5-325 MG per tablet Take 1-2 tablets by mouth every 4 (four) hours as needed. 02/06/13   [name], MD    Family History History reviewed. No pertinent family history.  Social History Social History   Tobacco Use   Smoking status: Never   Smokeless tobacco: Never  Substance Use Topics   Alcohol use: No   Drug use: No     Allergies   Penicillins   Review of Systems Review of Systems  Per HPI  Physical Exam Triage Vital Signs ED Triage Vitals  Enc Vitals Group     BP 12/14/21 1744 (!) 148/89     Pulse Rate 12/14/21 1744 70     Resp 12/14/21 1744 18     Temp 12/14/21 1744 97.9 F (36.6 C)     Temp Source 12/14/21 1744 Oral     SpO2 12/14/21 1744 99 %     Weight 12/14/21 1746 198 lb (89.8 kg)     Height 12/14/21 1746 5\' 6"  (1.676 m)     Head Circumference --      Peak Flow --      Pain Score 12/14/21 1746 10     Pain Loc --      Pain Edu? --      Excl. in GC? --    No data found.  Updated Vital Signs BP (!) 148/89 (BP Location: Left  Arm)   Pulse 70   Temp 97.9 F (36.6 C) (Oral)   Resp 18   Ht 5\' 6"  (1.676 m)   Wt 198 lb (89.8 kg)   LMP 02/23/2011   SpO2 99%   BMI 31.96 kg/m    Physical Exam Vitals and nursing note reviewed.  Constitutional:      General: She is not in acute distress.    Appearance: Normal appearance.  HENT:     Mouth/Throat:     Pharynx: Oropharynx is clear.  Eyes:     Conjunctiva/sclera: Conjunctivae normal.  Cardiovascular:     Rate and Rhythm: Normal rate and regular rhythm.     Pulses: Normal pulses.     Heart sounds: Normal heart sounds.  Pulmonary:     Effort: Pulmonary effort is normal.     Breath sounds: Normal breath sounds.  Musculoskeletal:        General: No swelling, tenderness, deformity or signs of injury. Normal range of motion.     Cervical back: Normal range of motion.     Comments: Full ROM of left leg, gait normal, sensation and pulses intact   Neurological:     Mental Status: She is alert and oriented to person, place, and time.     Gait: Gait normal.      UC Treatments / Results  Labs (all labs ordered are listed, but only abnormal results are displayed) Labs Reviewed - No data to display  EKG  Radiology DG Pelvis 1-2 Views  Result Date: 12/14/2021 CLINICAL DATA:  Pain EXAM: PELVIS - 1-2 VIEW COMPARISON:  None Available. FINDINGS: No fracture is seen. Degenerative changes are noted in both hips with bony spurs in the lateral aspect of acetabulum. Degenerative changes are noted in the visualized lower lumbar spine. IMPRESSION: No fracture or dislocation is seen. There are no focal lytic lesions. Degenerative changes are noted in the visualized lower lumbar spine and both hips. Electronically Signed   By: Ernie Avena M.D.   On: 12/14/2021 19:08   DG Femur Min 2 Views Left  Result Date: 12/14/2021 CLINICAL DATA:  Pain x1 week EXAM: LEFT FEMUR 2 VIEWS COMPARISON:  None Available. FINDINGS: No fracture or dislocation is seen. There are no focal lytic lesions. Degenerative changes are noted in the left hip with bony spurs in the lateral aspect of acetabulum. There is no effusion in the left knee joint. There is linear coarse calcification at the attachment of quadriceps tendon to the patella suggesting calcific tendinosis. IMPRESSION: No fracture or dislocation is seen. There are no focal lytic lesions. Degenerative changes are noted with bony spurs in the left hip. Electronically Signed   By: Ernie Avena M.D.   On: 12/14/2021 19:07    Procedures Procedures   Medications Ordered in UC Medications  ibuprofen (ADVIL) tablet 800 mg (800 mg Oral Given 12/14/21 1856)    Initial Impression / Assessment and Plan / UC Course  I have reviewed the triage vital signs and the nursing notes.  Pertinent labs & imaging results that were available during my care of the patient were reviewed by me and considered in my medical  decision making (see chart for details).  Ibuprofen dose given in clinic for pain control. Some improvement in pain with walking. X-rays of the left hip and femur obtained. No fracture or lytic lesions, no acute abnormality. Degenerative changes of lumbar spine, both hips. Some bony spurs, calcific tendinosis per radiology. I agree with interpretation.  Presentation suspicious for  arthritis. I recommend further imaging and follow up with orthopedics. Continue ibuprofen and tylenol. Return precautions discussed. Patient agrees to plan and is discharged in stable condition.  Final Clinical Impressions(s) / UC Diagnoses   Final diagnoses:  Acute pain of left hip     Discharge Instructions      Your x-rays showed no acute fracture or injury. There are some chronic, degenerative changes to your hips. Please follow up with orthopedics. They have walk-in hours or you can call to make an appointment.   I recommend taking ibuprofen every 6 hours for pain.      ED Prescriptions   None    PDMP not reviewed this encounter.   Kathrine Haddock 12/14/21 1610

## 2021-12-14 NOTE — ED Triage Notes (Signed)
Patient c/o left sided thigh and hip pain x 1 week.  No injury.  Patient can walk w/o difficutly, pain does come and go.  Applied heat, taking Tylenol and Ibuprofen.

## 2023-01-19 ENCOUNTER — Other Ambulatory Visit: Payer: Self-pay | Admitting: Obstetrics and Gynecology

## 2023-01-19 DIAGNOSIS — N6310 Unspecified lump in the right breast, unspecified quadrant: Secondary | ICD-10-CM

## 2023-01-25 ENCOUNTER — Ambulatory Visit: Admission: RE | Admit: 2023-01-25 | Payer: BC Managed Care – PPO | Source: Ambulatory Visit

## 2023-01-25 ENCOUNTER — Ambulatory Visit
Admission: RE | Admit: 2023-01-25 | Discharge: 2023-01-25 | Disposition: A | Discharge status: Home / Self Care | Payer: BC Managed Care – PPO | Source: Ambulatory Visit | Attending: Obstetrics and Gynecology | Admitting: Obstetrics and Gynecology

## 2023-01-25 DIAGNOSIS — N6310 Unspecified lump in the right breast, unspecified quadrant: Secondary | ICD-10-CM

## 2023-03-30 NOTE — Progress Notes (Signed)
Cardiology Office Note   Date:  03/31/2023   ID:  [name], DOB 11-Mar-1961, MRN [id number]  PCP:  Patient, No Pcp Per  Cardiologist:   None Referring:  Susan Moccasin, MD  Chief Complaint  Patient presents with   Abnormal ECG      History of Present Illness: Susan Riggs is a 62 y.o. female who presents for evaluation of atrial fib.   She has no past cardiac history but during a life screening recently was told she had atrial fibrillation.  She does not notice any palpitations, presyncope or syncope.  She did apparently have to go to an urgent care once because her heart rate was irregular but she never really noticed this.  I do not have a copy of the EKG that suggest atrial fibrillation.  She is otherwise not had any cardiac history.  She has not had any chest pressure, neck or arm discomfort.  She does not describe any shortness of breath, PND or orthopnea.  She walks routinely for exercise.  She can push a lawnmower.     Past Medical History:  Diagnosis Date   Diabetes (HCC)    Hypertension    SVD (spontaneous vaginal delivery)    x 2    Past Surgical History:  Procedure Laterality Date   ABDOMINAL HYSTERECTOMY Bilateral 02/04/2013   Procedure: HYSTERECTOMY ABDOMINAL;  Surgeon: Susan Cairo, MD;  Location: [facility];  Service: Gynecology;  Laterality: Bilateral;   COLONOSCOPY  2011   DILATION AND CURETTAGE OF UTERUS     x 2   HYSTEROSCOPY WITH D & C  03/25/2011   Procedure: DILATATION AND CURETTAGE (D&C) /HYSTEROSCOPY;  Surgeon: Susan Riggs;  Location: [facility];  Service: Gynecology;  Laterality: N/A;  Dilatation & Currettage; Hysteroscopy  With Polypectomy   SALPINGOOPHORECTOMY Bilateral 02/04/2013   Procedure: SALPINGO OOPHORECTOMY;  Surgeon: Susan Cairo, MD;  Location: [facility];  Service: Gynecology;  Laterality: Bilateral;   TUBAL LIGATION  89   WISDOM TOOTH EXTRACTION       Current Outpatient Medications  Medication Sig Dispense Refill    ibuprofen (ADVIL) 200 MG tablet Take 4 tablets (800 mg total) by mouth every 8 (eight) hours as needed for pain. 30 tablet 0   oxyCODONE-acetaminophen (PERCOCET/ROXICET) 5-325 MG per tablet Take 1-2 tablets by mouth every 4 (four) hours as needed. 30 tablet 0   No current facility-administered medications for this visit.    Allergies:   Penicillins    Social History:  The patient  reports that she has never smoked. She has never used smokeless tobacco. She reports that she does not drink alcohol and does not use drugs.   Family History:  The patient's family history includes Heart attack in her mother.    ROS:  Please see the history of present illness.   Otherwise, review of systems are positive for none.   All other systems are reviewed and negative.    PHYSICAL EXAM: VS:  BP 132/80 (BP Location: Right Arm, Patient Position: Sitting, Cuff Size: Large)   Pulse 67   Ht 5\' 5"  (1.651 m)   Wt 193 lb (87.5 kg)   LMP 02/23/2011   SpO2 96%   BMI 32.12 kg/m  , BMI Body mass index is 32.12 kg/m. GENERAL:  Well appearing HEENT:  Pupils equal round and reactive, fundi not visualized, oral mucosa unremarkable NECK:  No jugular venous distention, waveform within normal limits, carotid upstroke brisk and symmetric, no bruits, no thyromegaly  LYMPHATICS:  No cervical, inguinal adenopathy LUNGS:  Clear to auscultation bilaterally BACK:  No CVA tenderness CHEST:  Unremarkable HEART:  PMI not displaced or sustained,S1 and S2 within normal limits, no S3, no S4, no clicks, no rubs, no murmurs ABD:  Flat, positive bowel sounds normal in frequency in pitch, no bruits, no rebound, no guarding, no midline pulsatile mass, no hepatomegaly, no splenomegaly EXT:  2 plus pulses throughout, no edema, no cyanosis no clubbing SKIN:  No rashes no nodules NEURO:  Cranial nerves II through XII grossly intact, motor grossly intact throughout [facility]:  Cognitively intact, oriented to person place and  time    EKG:  EKG Interpretation Date/Time:  Friday March 31 2023 11:27:59 EDT Ventricular Rate:  67 PR Interval:    QRS Duration:  80 QT Interval:  386 QTC Calculation: 407 R Axis:   -29  Text Interpretation: Normal sinus rhythm PACs with bigeminy no acute ST T wave changes Confirmed by Rollene Rotunda ([id number]) on 03/31/2023 12:41:51 PM    Recent Labs: No results found for requested labs within last 365 days.    Lipid Panel No results found for: "CHOL", "TRIG", "HDL", "CHOLHDL", "VLDL", "LDLCALC", "LDLDIRECT"    Wt Readings from Last 3 Encounters:  03/31/23 193 lb (87.5 kg)  12/14/21 198 lb (89.8 kg)  02/04/13 217 lb (98.4 kg)      Other studies Reviewed: Additional studies/ records that were reviewed today include: Primary care records. Review of the above records demonstrates:  Please see elsewhere in the note.     ASSESSMENT AND PLAN:  ABNORMAL EKG: The patient was told she had atrial fibrillation but I do not see any report of this..  I will have her wear a 4-week monitor.  I suspect that they were seeing with atrial bigeminy.  She is not feeling this.  Labs have been unremarkable.  No further workup is suggested.  Diabetes mellitus: The patient should be on medications and she has had a good response to diet control which she still not at target with her A1c.  Susan Moccasin, MD has wanted her to be on something and I agree with this.  I have suggested and made a referral to endocrinology to see if they can convince her to take therapies for her diabetes.  Risk reduction: I think it would be prudent to get a coronary calcium score for further restratification which also will help clarify goals of therapy.  If she does have elevated calcium she likely should be on a statin.  I do not know if she debrided this because with her diabetes a statin would be indicated.  Her LDL is currently 99.  Hypertension: Her blood pressures upper limits of normal and she is going  to continue to keep an eye on this.  Current medicines are reviewed at length with the patient today.  The patient does not have concerns regarding medicines.  The following changes have been made:  no change  Labs/ tests ordered today include:   Orders Placed This Encounter  Procedures   CT CARDIAC SCORING (SELF PAY ONLY)   Ambulatory referral to Endocrinology   Cardiac event monitor   EKG 12-Lead     Disposition:   FU with me in two months.     Signed, Rollene Rotunda, MD  03/31/2023 12:45 PM     [facility]

## 2023-03-31 ENCOUNTER — Ambulatory Visit: Payer: BC Managed Care – PPO | Attending: Cardiology | Admitting: Cardiology

## 2023-03-31 ENCOUNTER — Encounter: Payer: Self-pay | Admitting: Cardiology

## 2023-03-31 VITALS — BP 132/80 | HR 67 | Ht 65.0 in | Wt 193.0 lb

## 2023-03-31 DIAGNOSIS — E13649 Other specified diabetes mellitus with hypoglycemia without coma: Secondary | ICD-10-CM | POA: Diagnosis not present

## 2023-03-31 DIAGNOSIS — Z136 Encounter for screening for cardiovascular disorders: Secondary | ICD-10-CM | POA: Diagnosis not present

## 2023-03-31 DIAGNOSIS — I4891 Unspecified atrial fibrillation: Secondary | ICD-10-CM

## 2023-03-31 NOTE — Patient Instructions (Signed)
    Testing/Procedures:  Your physician has recommended that you wear an event monitor. Event monitors are medical devices that record the heart's electrical activity. Doctors most often [place] these monitors to diagnose arrhythmias. Arrhythmias are problems with the speed or rhythm of the heartbeat. The monitor is a small, portable device. You can wear one while you do your normal daily activities. This is usually used to diagnose what is causing palpitations/syncope (passing out).   CORONARY CALCIUM SCORING CT SCAN AT THE [facility] [facility]   Follow-Up: At [facility], you and your health needs are our priority.  As part of our continuing mission to provide you with exceptional heart care, we have created designated Provider Care Teams.  These Care Teams include your primary Cardiologist (physician) and Advanced Practice Providers (APPs -  Physician Assistants and Nurse Practitioners) who all work together to provide you with the care you need, when you need it.  We recommend signing up for the patient portal called "MyChart".  Sign up information is provided on this After Visit Summary.  MyChart is used to connect with patients for Virtual Visits (Telemedicine).  Patients are able to view lab/test results, encounter notes, upcoming appointments, etc.  Non-urgent messages can be sent to your provider as well.   To learn more about what you can do with MyChart, go to [email or web address].    Your next appointment:   2 month(s)  Provider:   Rollene Rotunda MD

## 2023-04-05 DIAGNOSIS — I4891 Unspecified atrial fibrillation: Secondary | ICD-10-CM

## 2023-04-17 ENCOUNTER — Other Ambulatory Visit: Payer: Self-pay | Admitting: Obstetrics and Gynecology

## 2023-04-17 DIAGNOSIS — R928 Other abnormal and inconclusive findings on diagnostic imaging of breast: Secondary | ICD-10-CM

## 2023-04-20 ENCOUNTER — Telehealth: Payer: Self-pay | Admitting: Cardiology

## 2023-04-20 NOTE — Telephone Encounter (Signed)
Dr. Maryelizabeth Rowan called to see if Dr. Antoine Poche or nurse could fax or lastest appt notes from their appt on 10/11 to [phone]

## 2023-04-20 NOTE — Telephone Encounter (Signed)
Notes from 10/11 faxed to Dr Duanne Guess per request.

## 2023-04-27 ENCOUNTER — Ambulatory Visit (HOSPITAL_BASED_OUTPATIENT_CLINIC_OR_DEPARTMENT_OTHER)
Admission: RE | Admit: 2023-04-27 | Discharge: 2023-04-27 | Disposition: A | Discharge status: Home / Self Care | Payer: BC Managed Care – PPO | Source: Ambulatory Visit | Attending: Cardiology | Admitting: Cardiology

## 2023-04-27 DIAGNOSIS — Z136 Encounter for screening for cardiovascular disorders: Secondary | ICD-10-CM | POA: Insufficient documentation

## 2023-05-09 ENCOUNTER — Ambulatory Visit
Admission: RE | Admit: 2023-05-09 | Discharge: 2023-05-09 | Disposition: A | Discharge status: Home / Self Care | Payer: BC Managed Care – PPO | Source: Ambulatory Visit | Attending: Obstetrics and Gynecology | Admitting: Obstetrics and Gynecology

## 2023-05-09 DIAGNOSIS — R928 Other abnormal and inconclusive findings on diagnostic imaging of breast: Secondary | ICD-10-CM

## 2023-05-11 ENCOUNTER — Encounter: Payer: Self-pay | Admitting: *Deleted

## 2023-05-12 ENCOUNTER — Ambulatory Visit: Payer: BC Managed Care – PPO | Attending: Cardiology

## 2023-05-12 DIAGNOSIS — I4891 Unspecified atrial fibrillation: Secondary | ICD-10-CM

## 2023-05-17 ENCOUNTER — Encounter: Payer: Self-pay | Admitting: *Deleted

## 2023-05-23 ENCOUNTER — Other Ambulatory Visit: Payer: Self-pay | Admitting: *Deleted

## 2023-05-23 DIAGNOSIS — R918 Other nonspecific abnormal finding of lung field: Secondary | ICD-10-CM

## 2023-05-28 NOTE — Progress Notes (Unsigned)
  Cardiology Office Note:   Date:  05/28/2023  ID:  Susan Riggs, DOB 1960/07/08, MRN [id number] PCP: Patient, No Pcp Per  [facility] Providers Cardiologist:  None {  History of Present Illness:   Susan Riggs is a 62 y.o. female who presents for evaluation of atrial fib.   This is my second visit with her.  ***    ***  She has no past cardiac history but during a life screening recently was told she had atrial fibrillation.  She does not notice any palpitations, presyncope or syncope.  She did apparently have to go to an urgent care once because her heart rate was irregular but she never really noticed this.  I do not have a copy of the EKG that suggest atrial fibrillation.  She is otherwise not had any cardiac history.  She has not had any chest pressure, neck or arm discomfort.  She does not describe any shortness of breath, PND or orthopnea.  She walks routinely for exercise.  She can push a lawnmower.    ROS: ***  Studies Reviewed:    EKG:       ***  Risk Assessment/Calculations:   {Does this patient have ATRIAL FIBRILLATION?:[phone]} No BP recorded.  {Refresh Note OR Click here to enter BP  :1}***        Physical Exam:   VS:  LMP 02/23/2011    Wt Readings from Last 3 Encounters:  03/31/23 193 lb (87.5 kg)  12/14/21 198 lb (89.8 kg)  02/04/13 217 lb (98.4 kg)     GEN: Well nourished, well developed in no acute distress NECK: No JVD; No carotid bruits CARDIAC: ***RR, *** murmurs, rubs, gallops RESPIRATORY:  Clear to auscultation without rales, wheezing or rhonchi  ABDOMEN: Soft, non-tender, non-distended EXTREMITIES:  No edema; No deformity   ASSESSMENT AND PLAN:   Abnormal  EKG:  ***   The patient was told she had atrial fibrillation but I do not see any report of this..  I will have her wear a 4-week monitor.  I suspect that they were seeing with atrial bigeminy.  She is not feeling this.  Labs have been unremarkable.  No further workup is suggested.    Diabetes mellitus:   ***   The patient should be on medications and she has had a good response to diet control which she still not at target with her A1c.  [name], MD has wanted her to be on something and I agree with this.  I have suggested and made a referral to endocrinology to see if they can convince her to take therapies for her diabetes.   Risk reduction:   the calcium score was zero. ***  I think it would be prudent to get a coronary calcium score for further restratification which also will help clarify goals of therapy.  If she does have elevated calcium she likely should be on a statin.  I do not know if she debrided this because with her diabetes a statin would be indicated.  Her LDL is currently 99.   Hypertension: Her blood pressures is *** upper limits of normal and she is going to continue to keep an eye on this.  Pulmonary nodule:  ***       Follow up ***  Signed, Rollene Rotunda, MD

## 2023-05-31 ENCOUNTER — Encounter: Payer: Self-pay | Admitting: Cardiology

## 2023-05-31 ENCOUNTER — Ambulatory Visit: Payer: BC Managed Care – PPO | Admitting: Internal Medicine

## 2023-05-31 ENCOUNTER — Ambulatory Visit: Payer: BC Managed Care – PPO | Attending: Cardiology | Admitting: Cardiology

## 2023-05-31 ENCOUNTER — Encounter: Payer: Self-pay | Admitting: Internal Medicine

## 2023-05-31 VITALS — BP 120/74 | HR 34 | Ht 66.0 in | Wt 191.8 lb

## 2023-05-31 VITALS — BP 130/80 | HR 96 | Ht 65.0 in | Wt 192.2 lb

## 2023-05-31 DIAGNOSIS — E7849 Other hyperlipidemia: Secondary | ICD-10-CM

## 2023-05-31 DIAGNOSIS — R911 Solitary pulmonary nodule: Secondary | ICD-10-CM

## 2023-05-31 DIAGNOSIS — I10 Essential (primary) hypertension: Secondary | ICD-10-CM

## 2023-05-31 DIAGNOSIS — R9431 Abnormal electrocardiogram [ECG] [EKG]: Secondary | ICD-10-CM | POA: Diagnosis not present

## 2023-05-31 DIAGNOSIS — E1159 Type 2 diabetes mellitus with other circulatory complications: Secondary | ICD-10-CM | POA: Diagnosis not present

## 2023-05-31 DIAGNOSIS — E118 Type 2 diabetes mellitus with unspecified complications: Secondary | ICD-10-CM

## 2023-05-31 LAB — GLUCOSE, POCT (MANUAL RESULT ENTRY): POC Glucose: 147 mg/dL — AB (ref 70–99)

## 2023-05-31 LAB — POCT GLYCOSYLATED HEMOGLOBIN (HGB A1C): Hemoglobin A1C: 6.9 % — AB (ref 4.0–5.6)

## 2023-05-31 NOTE — Progress Notes (Signed)
Patient ID: Susan Riggs, female   DOB: 1960/07/19, 62 y.o.   MRN: [id number]  HPI: Susan Riggs is a 62 y.o.-year-old female, referred by her cardiologist, Dr. Antoine Poche, for management of DM2, dx in 2018, non-insulin-dependent, controlled, with complications (atrial fibrillation). PCP: Dr. Duanne Guess  Reviewed HbA1c: 11/18/2022: HbA1c 6.9% 2022-2024: HbA1c 6.9% reportedly No results found for: "HGBA1C" 2018: HbA1c 8% reportedly  She is not on any medications now. Prev. On Metformin 500 mg 3x a day >> decreased to gradually and then stopped 2/2 good control 09/2021. She was Rx'ed Rybelsus, Ozempic >> did not start.  Pt checks her sugars 2x a day and they are: - am: 90-120, 140 - 2h after b'fast: n/c - before lunch: n/c - 2h after lunch: n/c - before dinner: n/c - 2h after dinner: 140-150, 200 - bedtime: n/c - nighttime: n/c Lowest sugar was 89; ? hypoglycemia awareness. Highest sugar was 200.  Glucometer: One Touch ultra  Pt's meals are: - Breakfast: may skip, oatmeal, walnuts, blueberries, banana, dates - Lunch: skips - Dinner: rice/beans, veggies but recently introduced back meat and also some eggs and cheese - Snacks: nuts, pretzels She walks for exercise twice a week.  - no CKD, last BUN/creatinine:  08/23/2022: 14/0.8, GFR 82.2, glucose 106 Lab Results  Component Value Date   BUN 13 02/04/2013   CREATININE 0.76 02/04/2013  No results found for: "MICRALBCREAT" She is on valsartan 80 mg daily.  -+ HL; last set of lipids: 11/18/2022: 211/64/65/133 No results found for: "CHOL", "HDL", "LDLCALC", "LDLDIRECT", "TRIG", "CHOLHDL" She was on Crestor 5 mg daily - off for 2 years CAC score 0 (05/16/2023).  - last eye exam was in summer 2024. No DR reportedly.  - no numbness and tingling in her feet.  Pt has FH of DM in mother, sisters.  She also has a history of PUD, GERD, HTN, osteoarthritis, obesity class III, back pain, h/o TAH., vitamin D deficiency, and emphysema  seen on CT 05/16/2023.  ROS: Constitutional: no weight gain, no weight loss, no fatigue, + hot flashes, no nocturia Eyes: no blurry vision, no xerophthalmia ENT: no sore throat, no nodules palpated in neck, no dysphagia, no odynophagia, no hoarseness, no tinnitus, no hypoacusis Cardiovascular: no CP, no SOB, no palpitations, no leg swelling Respiratory: no cough, no SOB, no wheezing Gastrointestinal: no N, no V, no D, no C, no acid reflux Musculoskeletal: no muscle, no joint aches Skin: no rash, + hair loss Neurological: no tremors, no numbness or tingling/no dizziness/no HAs Psychiatric: no depression, no anxiety  Past Medical History:  Diagnosis Date   Diabetes (HCC)    Hypertension    SVD (spontaneous vaginal delivery)    x 2   Past Surgical History:  Procedure Laterality Date   ABDOMINAL HYSTERECTOMY Bilateral 02/04/2013   Procedure: HYSTERECTOMY ABDOMINAL;  Surgeon: [name], MD;  Location: [facility];  Service: Gynecology;  Laterality: Bilateral;   COLONOSCOPY  2011   DILATION AND CURETTAGE OF UTERUS     x 2   HYSTEROSCOPY WITH D & C  03/25/2011   Procedure: DILATATION AND CURETTAGE (D&C) /HYSTEROSCOPY;  Surgeon: [name];  Location: [facility];  Service: Gynecology;  Laterality: N/A;  Dilatation & Currettage; Hysteroscopy  With Polypectomy   SALPINGOOPHORECTOMY Bilateral 02/04/2013   Procedure: SALPINGO OOPHORECTOMY;  Surgeon: [name], MD;  Location: [facility];  Service: Gynecology;  Laterality: Bilateral;   TUBAL LIGATION  89   WISDOM TOOTH EXTRACTION     Social History  Socioeconomic History   Marital status: Divorced    Spouse name: Not on file   Number of children: Not on file   Years of education: Not on file   Highest education level: Not on file  Occupational History   Not on file  Tobacco Use   Smoking status: Never   Smokeless tobacco: Never  Substance and Sexual Activity   Alcohol use: No   Drug use: No   Sexual activity: Yes    Birth  control/protection: Post-menopausal  Other Topics Concern   Not on file  Social History Narrative   Children and grandchildren live with the patient.    Social Determinants of Health   Financial Resource Strain: Not on file  Food Insecurity: Not on file  Transportation Needs: Not on file  Physical Activity: Not on file  Stress: Not on file  Social Connections: Not on file  Intimate Partner Violence: Not on file   No current outpatient medications on file prior to visit.   No current facility-administered medications on file prior to visit.   Allergies  Allergen Reactions   Penicillins Rash   Family History  Problem Relation Age of Onset   Heart attack Mother        Sarcoid   PE: BP 130/80   Pulse 96   Ht 5\' 5"  (1.651 m)   Wt 192 lb 3.2 oz (87.2 kg)   LMP 02/23/2011   SpO2 97%   BMI 31.98 kg/m  Wt Readings from Last 3 Encounters:  05/31/23 192 lb 3.2 oz (87.2 kg)  03/31/23 193 lb (87.5 kg)  12/14/21 198 lb (89.8 kg)   Constitutional: overweight, in NAD Eyes:  EOMI, no exophthalmos ENT: no neck masses, no cervical lymphadenopathy Cardiovascular: tachycardia, RR, No MRG Respiratory: CTA B Musculoskeletal: no deformities Skin:no rashes Neurological: no tremor with outstretched hands Diabetic Foot Exam - Simple   Simple Foot Form Diabetic Foot exam was performed with the following findings: Yes 05/31/2023 10:07 AM  Visual Inspection No deformities, no ulcerations, no other skin breakdown bilaterally: Yes Sensation Testing Intact to touch and monofilament testing bilaterally: Yes Pulse Check Posterior Tibialis and Dorsalis pulse intact bilaterally: Yes Comments    ASSESSMENT: 1. DM2, non-insulin-dependent, controlled, with complications - Atrial fibrillation-diagnosed 01/2023 on Life screening  2. HL  PLAN:  1. Patient with type II diabetes, on oral antidiabetic regimen with low-dose p.o. GLP-1 receptor agonist.  Latest HbA1c was 6.9% in 10/2022.  At  today's visit, we checked another HbA1c: 6.9% (stable).  She mentions that she had a higher HbA1c around the time of her type 2 diabetes diagnosis and the HbA1c actually increased after starting metformin.  She started to improve her diet, by switching to a mostly plant-based diet and was able to taper down and eventually stop metformin and 2022.  Her HbA1c remained at 6.9% since then.  More recently, she thought that she may need more proteins and reintroduced meat, and also some eggs and cheese. -She was recently found to have atrial fibrillation on life screening at work.  She was referred to Dr. Antoine Poche.  After investigation, she was not considered to have a higher cardiovascular risk but she was referred to endocrinology for diabetic management. -At today's visit, we reviewed together her blood sugars at home.  They are mostly at goal, only diet controlled.  However, we discussed that reintroducing animal protein and therefore also cholesterol can cause inflammation and worsening of her insulin resistance.  I would expect her blood  sugars to worsen after this dietary change. We discussed about the benefits of a plant based diet and I strongly recommended for her to return to this, if possible. She did enjoy this in the past and would like to return to it.  She mentions that she was also feeling better on it. -We discussed about specific healthy food substitutions that may help keep her blood sugars better under control.  I also recommended to start high glycemic index foods like pretzels and dried fruit.  I gave her 2 book references for the plant-based diet. - I suggested to:  Patient Instructions  Please continue off medication.  Eliminate pretzels, dried fruit.  Please return in 3-4 months with your sugar log.   - Strongly advised her to start checking sugars at different times of the day - check 1x a day, rotating checks - discussed about CBG targets for treatment: 80-130 mg/dL before meals  and <161 mg/dL after meals; target WRU0A <7%. - given foot care handout and explained the principles  - given instructions for hypoglycemia management "15-15 rule"  - advised for yearly eye exams  - will check an ACR at next visit - Return to clinic in 3-4 mo   2. HL - Reviewed latest lipid panel from 10/2022: LDL above target, at 133 No results found for: "CHOL", "HDL", "LDLCALC", "LDLDIRECT", "TRIG", "CHOLHDL" - she was on a statin (Crestor 5 mg daily) without side effects, but she came off after her diabetes became better controlled.  This was not restarted at last visit with Dr. Antoine Poche especially as CAC score was 0 last month -We did discuss that a plant-based diet can greatly help lowering her LDL  Carlus Pavlov, MD PhD [facility] Endocrinology

## 2023-05-31 NOTE — Patient Instructions (Addendum)
Please continue off medication.  Eliminate pretzels, dried fruit.  Please return in 3-4 months with your sugar log.   PATIENT INSTRUCTIONS FOR TYPE 2 DIABETES:  DIET AND EXERCISE Diet and exercise is an important part of diabetic treatment.  We recommended aerobic exercise in the form of brisk walking (working between 40-60% of maximal aerobic capacity, similar to brisk walking) for 150 minutes per week (such as 30 minutes five days per week) along with 3 times per week performing 'resistance' training (using various gauge rubber tubes with handles) 5-10 exercises involving the major muscle groups (upper body, lower body and core) performing 10-15 repetitions (or near fatigue) each exercise. Start at half the above goal but build slowly to reach the above goals. If limited by weight, joint pain, or disability, we recommend daily walking in a swimming pool with water up to waist to reduce pressure from joints while allow for adequate exercise.    BLOOD GLUCOSES Monitoring your blood glucoses is important for continued management of your diabetes. Please check your blood glucoses 2-4 times a day: fasting, before meals and at bedtime (you can rotate these measurements - e.g. one day check before the 3 meals, the next day check before 2 of the meals and before bedtime, etc.).   HYPOGLYCEMIA (low blood sugar) Hypoglycemia is usually a reaction to not eating, exercising, or taking too much insulin/ other diabetes drugs.  Symptoms include tremors, sweating, hunger, confusion, headache, etc. Treat IMMEDIATELY with 15 grams of Carbs: 4 glucose tablets  cup regular juice/soda 2 tablespoons raisins 4 teaspoons sugar 1 tablespoon honey Recheck blood glucose in 15 mins and repeat above if still symptomatic/blood glucose <100.  RECOMMENDATIONS TO REDUCE YOUR RISK OF DIABETIC COMPLICATIONS: * Take your prescribed MEDICATION(S) * Follow a DIABETIC diet: Complex carbs, fiber rich foods, (monounsaturated  and polyunsaturated) fats * AVOID saturated/trans fats, high fat foods, >2,300 mg salt per day. * EXERCISE at least 5 times a week for 30 minutes or preferably daily.  * DO NOT SMOKE OR DRINK more than 1 drink a day. * Check your FEET every day. Do not wear tightfitting shoes. Contact us if you develop an ulcer * See your EYE doctor once a year or more if needed * Get a FLU shot once a year * Get a PNEUMONIA vaccine once before and once after age 42 years  GOALS:  * Your Hemoglobin A1c of <7%  * fasting sugars need to be 80-130 * after meals sugars need to be <180 (2h after you start eating) * Your Systolic BP should be 130 or lower  * Your Diastolic BP should be 80 or lower  * Your HDL (Good Cholesterol) should be 40 or higher  * Your LDL (Bad Cholesterol) should be ideally <70. * Your Triglycerides should be 150 or lower  * Your Urine microalbumin (kidney function) should be <30 * Your Body Mass Index should be 25 or lower   Please consider the following ways to cut down carbs and fat and increase fiber and micronutrients in your diet: - substitute whole grain for white bread or pasta - substitute brown rice for white rice - substitute 90-calorie flat bread pieces for slices of bread when possible - substitute sweet potatoes or yams for white potatoes - substitute humus for margarine - substitute tofu for cheese when possible - substitute almond or rice milk for regular milk (would not drink soy milk daily due to concern for soy estrogen influence on breast cancer risk) -  substitute dark chocolate for other sweets when possible - substitute water - can add lemon or orange slices for taste - for diet sodas (artificial sweeteners will trick your body that you can eat sweets without getting calories and will lead you to overeating and weight gain in the long run) - do not skip breakfast or other meals (this will slow down the metabolism and will result in more weight gain over time)  -  can try smoothies made from fruit and almond/rice milk in am instead of regular breakfast - can also try old-fashioned (not instant) oatmeal made with almond/rice milk in am - order the dressing on the side when eating salad at a restaurant (pour less than half of the dressing on the salad) - eat as [place] meat as possible - can try juicing, but should not forget that juicing will get rid of the fiber, so would alternate with eating raw veg./fruits or drinking smoothies - use as O[place]reich oil as possible, even when using olive oil - can dress a salad with a mix of balsamic vinegar and lemon juice, for e.g. - use agave nectar, stevia sugar, or regular sugar rather than artificial sweateners - steam or broil/roast veggies  - snack on veggies/fruit/nuts (unsalted, preferably) when possible, rather than processed foods - reduce or eliminate aspartame in diet (it is in diet sodas, chewing gum, etc) Read the labels!  Try to read Dr. Katherina Right book: "Program for Reversing Diabetes" for other ideas for healthy eating. Also: Karlyne Greenspan: "Kick Diabetes Cookbook".

## 2023-05-31 NOTE — Patient Instructions (Signed)
 Medication Instructions:  No changes.  *If you need a refill on your cardiac medications before your next appointment, please call your pharmacy*     Follow-Up: At [facility], you and your health needs are our priority.  As part of our continuing mission to provide you with exceptional heart care, we have created designated Provider Care Teams.  These Care Teams include your primary Cardiologist (physician) and Advanced Practice Providers (APPs -  Physician Assistants and Nurse Practitioners) who all work together to provide you with the care you need, when you need it.  Your next appointment:    As needed.   Provider:   Rollene Rotunda, MD

## 2023-09-29 ENCOUNTER — Ambulatory Visit: Payer: BC Managed Care – PPO | Admitting: Internal Medicine

## 2023-12-12 ENCOUNTER — Encounter: Payer: Self-pay | Admitting: Internal Medicine

## 2023-12-12 ENCOUNTER — Ambulatory Visit: Admitting: Internal Medicine

## 2023-12-12 VITALS — BP 122/70 | HR 72 | Ht 66.0 in | Wt 189.2 lb

## 2023-12-12 DIAGNOSIS — E1159 Type 2 diabetes mellitus with other circulatory complications: Secondary | ICD-10-CM | POA: Diagnosis not present

## 2023-12-12 DIAGNOSIS — E7849 Other hyperlipidemia: Secondary | ICD-10-CM

## 2023-12-12 LAB — POCT GLYCOSYLATED HEMOGLOBIN (HGB A1C): Hemoglobin A1C: 6.7 % — AB (ref 4.0–5.6)

## 2023-12-12 NOTE — Progress Notes (Signed)
 Patient ID: Susan Riggs, female   DOB: 1960-06-27, 63 y.o.   MRN: [id number]  HPI: Susan Riggs is a 63 y.o.-year-old female, initially referred by her cardiologist, Dr. Lavona, returning for follow-up for DM2, dx in 2018, non-insulin-dependent, controlled, with complications (atrial fibrillation).  Last visit 6 months ago. PCP: Dr. Waylan  Reviewed HbA1c: 09/04/2023: HbA1c 7.2% Lab Results  Component Value Date   HGBA1C 6.9 (A) 05/31/2023  11/18/2022: HbA1c 6.9% 2022-2024: HbA1c 6.9% reportedly 2018: HbA1c 8% reportedly  She is not on any medications now. Prev. On Metformin 500 mg 3x a day >> decreased to gradually and then stopped 2/2 good control 09/2021. She was Rx'ed Rybelsus, Ozempic >> did not start.  Pt checks her sugars 2x a day and they are: - am: 90-120, 140 >> 91-143 - 2h after b'fast: n/c - before lunch: n/c - 2h after lunch: n/c - before dinner: n/c - 2h after dinner: 140-150, 200 >> 180-190 - bedtime: n/c - nighttime: n/c Lowest sugar was 89 >> 91; ? hypoglycemia awareness. Highest sugar was 200 >> 190.  Glucometer: One Touch ultra  Pt's meals are: - Breakfast: may skip, oatmeal, walnuts, blueberries, banana, dates - Lunch: skips - Dinner: rice/beans, veggies but recently introduced back meat and also some eggs and cheese - Snacks: nuts, pretzels She walks for exercise twice a week.  - no CKD, last BUN/creatinine:  09/04/2023: 13/0.66, GFR 84, glucose 114 08/23/2022: 14/0.8, GFR 82.2, glucose 106 Lab Results  Component Value Date   BUN 13 02/04/2013   CREATININE 0.76 02/04/2013  No results found for: MICRALBCREAT She is on valsartan 80 mg daily.  -+ HL; last set of lipids: 09/04/2023: 213/60/70/128 11/18/2022: 211/64/65/133 No results found for: CHOL, HDL, LDLCALC, LDLDIRECT, TRIG, CHOLHDL She was on Crestor 5 mg daily - off for 2 years CAC score 0 (05/16/2023).  - last eye exam was in summer 2024. No DR reportedly.  - no numbness  and tingling in her feet.  Last foot exam 05/31/2023.  Pt has FH of DM in mother, sisters.  She also has a history of PUD, GERD, HTN, osteoarthritis, obesity class III, back pain, h/o TAH., vitamin D deficiency, and emphysema seen on CT 05/16/2023.  ROS: + See HPI  Past Medical History:  Diagnosis Date   Diabetes (HCC)    Hypertension    SVD (spontaneous vaginal delivery)    x 2   Past Surgical History:  Procedure Laterality Date   ABDOMINAL HYSTERECTOMY Bilateral 02/04/2013   Procedure: HYSTERECTOMY ABDOMINAL;  Surgeon: Susan Corona, MD;  Location: [facility];  Service: Gynecology;  Laterality: Bilateral;   COLONOSCOPY  2011   DILATION AND CURETTAGE OF UTERUS     x 2   HYSTEROSCOPY WITH D & C  03/25/2011   Procedure: DILATATION AND CURETTAGE (D&C) /HYSTEROSCOPY;  Surgeon: Susan Riggs;  Location: [facility];  Service: Gynecology;  Laterality: N/A;  Dilatation & Currettage; Hysteroscopy  With Polypectomy   SALPINGOOPHORECTOMY Bilateral 02/04/2013   Procedure: SALPINGO OOPHORECTOMY;  Surgeon: Susan Corona, MD;  Location: [facility];  Service: Gynecology;  Laterality: Bilateral;   TUBAL LIGATION  89   WISDOM TOOTH EXTRACTION     Social History   Socioeconomic History   Marital status: Divorced    Spouse name: Not on file   Number of children: 2   Years of education: Not on file   Highest education level: Not on file  Occupational History   Occupation: retired Runner, broadcasting/film/video  Tobacco Use   Smoking  status: Never   Smokeless tobacco: Never  Substance and Sexual Activity   Alcohol use: No   Drug use: No   Sexual activity: Yes    Birth control/protection: Post-menopausal  Other Topics Concern   Not on file  Social History Narrative   Children and grandchildren live with the patient.    Social Drivers of Corporate investment banker Strain: Not on file  Food Insecurity: Not on file  Transportation Needs: Not on file  Physical Activity: Not on file  Stress: Not on file  Social  Connections: Not on file  Intimate Partner Violence: Not on file   No current outpatient medications on file prior to visit.   No current facility-administered medications on file prior to visit.   Allergies  Allergen Reactions   Penicillins Rash   Family History  Problem Relation Age of Onset   Diabetes Mother    Heart attack Mother        Sarcoid   Diabetes Sister    PE: BP 122/70   Pulse 72   Ht 5' 6 (1.676 m)   Wt 189 lb 3.2 oz (85.8 kg)   LMP 02/23/2011   SpO2 99%   BMI 30.54 kg/m  Wt Readings from Last 3 Encounters:  12/12/23 189 lb 3.2 oz (85.8 kg)  05/31/23 191 lb 12.8 oz (87 kg)  05/31/23 192 lb 3.2 oz (87.2 kg)   Constitutional: overweight, in NAD Eyes:  EOMI, no exophthalmos ENT: no neck masses, no cervical lymphadenopathy Cardiovascular: RRR, No MRG Respiratory: CTA B Musculoskeletal: no deformities Skin:no rashes Neurological: no tremor with outstretched hands  ASSESSMENT: 1. DM2, non-insulin-dependent, controlled, with complications - Atrial fibrillation-diagnosed 01/2023 on [facility] screening. After investigation, she was not considered to have a higher cardiovascular risk but she was referred to endocrinology for diabetic management.  2. HL  PLAN:  1. Patient with history of type 2 diabetes, previously on metformin which was stopped in 2023 due to good control.  She was prescribed Ozempic and Rybelsus but she did not start this.  At last visit, which was in fact our first visit, HbA1c was stable, at 6.9% and she was working on her diet.  She did previously follow a plant-based diet but at last visit she mentioned reintroducing meat and also some eggs and cheese.  Blood sugars at home were mostly at goal, and we discussed about how to further adjust diet.  I did recommend return with a plant-based diet and recommended specific healthy food substitutions that may help her blood sugars to stay better under control.  I recommended to stop high glycemic index  foods like pretzels and dried fruit.  I did give her references about a plant-based diet.  However, we did not start any medication at that time. - She did not return in 3 to 4 months, as advised, but had another HbA1c 3 months ago and this was higher, at 7.2%. - at today's visit, she mentions that she noticed higher blood sugars for a period of time but they started to improve.  Her sugars after dinner are still slightly high, up to 190 and we discussed about working on her dinners to reduce high glycemic index foods and to include healthy fats.  We also discussed about possibly using metformin.  I advised her about the many benefits of the medication but she is reticent to try this as she mentions that she had higher blood sugars when taking metformin.  We discussed about other options for  GLP-1 receptor agonist and SGLT2 inhibitors, but now, especially in the setting of the lower HbA1c (see below), we decided to continue off medication. - I suggested to:  Patient Instructions  Please continue off medication.  Try to go to [facility] or [facility].  Please return in 6 months with your sugar log.   - we checked her HbA1c: 6.7% (lower) - advised to check sugars at different times of the day - 1x a day, rotating check times - advised for yearly eye exams >> she is UTD - she is interested in seeing a podiatrist particularly due to brittle nails.   - return to clinic in 6 months  2. HL - Latest lipid panel was reviewed from 09/04/2023: 213/60/70/128 -LDL above target No results found for: CHOL, HDL, LDLCALC, LDLDIRECT, TRIG, CHOLHDL - She was previously on Crestor 5 mg daily without side effects but she came off after her diabetes became better controlled.  This was not restarted by Dr. Lavona especially as her CAC score was 0 -We did discuss at last visit that a plant-based diet can greatly help lowering her LDL  Lela Fendt, MD PhD [facility]

## 2023-12-12 NOTE — Patient Instructions (Addendum)
 Please continue off medication.  Try to go to [facility] or [facility].  Please return in 6 months with your sugar log.

## 2023-12-12 NOTE — Addendum Note (Signed)
 Addended by: CLEOTILDE ROLIN RAMAN on: 12/12/2023 01:20 PM   Modules accepted: Orders

## 2023-12-13 ENCOUNTER — Ambulatory Visit: Payer: Self-pay | Admitting: Internal Medicine

## 2023-12-13 LAB — MICROALBUMIN / CREATININE URINE RATIO
Creatinine, Urine: 355 mg/dL — ABNORMAL HIGH (ref 20–275)
Microalb Creat Ratio: 104 mg/g{creat} — ABNORMAL HIGH (ref <30)
Microalb, Ur: 36.8 mg/dL

## 2024-04-18 DIAGNOSIS — Z124 Encounter for screening for malignant neoplasm of cervix: Secondary | ICD-10-CM | POA: Diagnosis not present

## 2024-04-18 DIAGNOSIS — Z1272 Encounter for screening for malignant neoplasm of vagina: Secondary | ICD-10-CM | POA: Diagnosis not present

## 2024-04-18 DIAGNOSIS — Z6832 Body mass index (BMI) 32.0-32.9, adult: Secondary | ICD-10-CM | POA: Diagnosis not present

## 2024-04-18 DIAGNOSIS — Z1151 Encounter for screening for human papillomavirus (HPV): Secondary | ICD-10-CM | POA: Diagnosis not present

## 2024-04-18 DIAGNOSIS — Z1231 Encounter for screening mammogram for malignant neoplasm of breast: Secondary | ICD-10-CM | POA: Diagnosis not present

## 2024-04-30 ENCOUNTER — Ambulatory Visit
Admission: RE | Admit: 2024-04-30 | Discharge: 2024-04-30 | Disposition: A | Discharge status: Home / Self Care | Source: Ambulatory Visit | Attending: Cardiology | Admitting: Cardiology

## 2024-04-30 DIAGNOSIS — R918 Other nonspecific abnormal finding of lung field: Secondary | ICD-10-CM

## 2024-04-30 DIAGNOSIS — J984 Other disorders of lung: Secondary | ICD-10-CM | POA: Diagnosis not present

## 2024-04-30 DIAGNOSIS — J439 Emphysema, unspecified: Secondary | ICD-10-CM | POA: Diagnosis not present

## 2024-05-04 ENCOUNTER — Ambulatory Visit: Payer: Self-pay | Admitting: Cardiology

## 2024-05-14 DIAGNOSIS — M25512 Pain in left shoulder: Secondary | ICD-10-CM | POA: Diagnosis not present

## 2024-05-15 NOTE — Progress Notes (Signed)
 Attempted to call patient in regards her CT chest results. No answer and is Unable to leave voicemail due to Voice mailbox is not set up.   Printed, highlighted Dr Hochrein's comments and mailed to patient. Also send a fpl group.

## 2024-06-17 ENCOUNTER — Encounter: Payer: Self-pay | Admitting: Internal Medicine

## 2024-06-17 ENCOUNTER — Other Ambulatory Visit

## 2024-06-17 ENCOUNTER — Ambulatory Visit: Admitting: Internal Medicine

## 2024-06-17 VITALS — BP 124/68 | HR 81 | Ht 66.0 in | Wt 198.2 lb

## 2024-06-17 DIAGNOSIS — E1159 Type 2 diabetes mellitus with other circulatory complications: Secondary | ICD-10-CM

## 2024-06-17 DIAGNOSIS — Z7984 Long term (current) use of oral hypoglycemic drugs: Secondary | ICD-10-CM

## 2024-06-17 DIAGNOSIS — E7849 Other hyperlipidemia: Secondary | ICD-10-CM | POA: Diagnosis not present

## 2024-06-17 LAB — POCT GLYCOSYLATED HEMOGLOBIN (HGB A1C): Hemoglobin A1C: 8.3 % — AB (ref 4.0–5.6)

## 2024-06-17 MED ORDER — ACCU-CHEK SOFTCLIX LANCETS MISC: 3 refills | Status: AC

## 2024-06-17 MED ORDER — GLUCOSE BLOOD VI STRP: ORAL_STRIP | 3 refills | Status: AC

## 2024-06-17 MED ORDER — ACCU-CHEK GUIDE W/DEVICE KIT: PACK | 0 refills | Status: AC

## 2024-06-17 NOTE — Progress Notes (Signed)
 Patient ID: Susan Riggs, female   DOB: December 27, 1960, 63 y.o.   MRN: [id number]  HPI: Susan Riggs is a 63 y.o.-year-old female, initially referred by her cardiologist, Dr. Lavona, returning for follow-up for DM2, dx in 2018, non-insulin-dependent, controlled, with complications (atrial fibrillation).  Last visit 6 months ago. PCP: Dr. Waylan  Interim hx.: No increased urination, blurry vision, nausea, chest pain. She stopped eating meat 1.5 years ago. Since last visit, she restarted to put honey in her tea.  Reviewed HbA1c: Lab Results  Component Value Date   HGBA1C 6.7 (A) 12/12/2023   HGBA1C 6.9 (A) 05/31/2023  09/04/2023: HbA1c 7.2% 11/18/2022: HbA1c 6.9% 2022-2024: HbA1c 6.9% reportedly 2018: HbA1c 8% reportedly  She is not on any medications now. Prev. On Metformin 500 mg 3x a day >> decreased to gradually and then stopped 09/2021 - felt that this was not effective.  She was Rx'ed Rybelsus, Ozempic >> did not start.  Pt was checking her sugars 2x a day, but now checking seldom: - am: 90-120, 140 >> 91-143 >> 102-130 - 2h after b'fast: n/c - before lunch: n/c - 2h after lunch: n/c - before dinner: n/c - 2h after dinner: 140-150, 200 >> 180-190 >> n/c - bedtime: n/c - nighttime: n/c Lowest sugar was 89 >> 91 >> 102; ? hypoglycemia awareness. Highest sugar was 200 >> 190 >> 130.  Glucometer: One Touch ultra  Pt's meals are: - Breakfast: may skip, oatmeal, walnuts, blueberries, banana, dates - Lunch: skips - Dinner: rice/beans, veggies but recently introduced back meat and also some eggs and cheese - Snacks: nuts, pretzels She walks for exercise.  - no CKD, last BUN/creatinine:  09/04/2023: 13/0.66, GFR 84, glucose 114 08/23/2022: 14/0.8, GFR 82.2, glucose 106 Lab Results  Component Value Date   BUN 13 02/04/2013   CREATININE 0.76 02/04/2013   Lab Results  Component Value Date   MICRALBCREAT 104 (H) 12/12/2023  She is on valsartan 80 mg daily.  -+ HL; last  set of lipids: 09/04/2023: 213/60/70/128 11/18/2022: 211/64/65/133 No results found for: CHOL, HDL, LDLCALC, LDLDIRECT, TRIG, CHOLHDL She was on Crestor 5 mg daily - off for 2 years CAC score 0 (05/16/2023).  - last eye exam was in 2025. No DR reportedly.  - no numbness and tingling in her feet.  Last foot exam 05/31/2023.  Pt has FH of DM in mother, sisters.  She also has a history of PUD, GERD, HTN, osteoarthritis, obesity class III, back pain, h/o TAH., vitamin D deficiency, and emphysema seen on CT 05/16/2023.  She prev. Saw a functional dr. 2 years ago >> then stopped as the provider died.  ROS: + See HPI  Past Medical History:  Diagnosis Date   Diabetes (HCC)    Hypertension    SVD (spontaneous vaginal delivery)    x 2   Past Surgical History:  Procedure Laterality Date   ABDOMINAL HYSTERECTOMY Bilateral 02/04/2013   Procedure: HYSTERECTOMY ABDOMINAL;  Surgeon: Truman Corona, MD;  Location: [facility];  Service: Gynecology;  Laterality: Bilateral;   COLONOSCOPY  2011   DILATION AND CURETTAGE OF UTERUS     x 2   HYSTEROSCOPY WITH D & C  03/25/2011   Procedure: DILATATION AND CURETTAGE (D&C) /HYSTEROSCOPY;  Surgeon: Truman Corona;  Location: [facility];  Service: Gynecology;  Laterality: N/A;  Dilatation & Currettage; Hysteroscopy  With Polypectomy   SALPINGOOPHORECTOMY Bilateral 02/04/2013   Procedure: SALPINGO OOPHORECTOMY;  Surgeon: Truman Corona, MD;  Location: [facility];  Service: Gynecology;  Laterality: Bilateral;   TUBAL LIGATION  89   WISDOM TOOTH EXTRACTION     Social History   Socioeconomic History   Marital status: Divorced    Spouse name: Not on file   Number of children: 2   Years of education: Not on file   Highest education level: Not on file  Occupational History   Occupation: retired runner, broadcasting/film/video  Tobacco Use   Smoking status: Never   Smokeless tobacco: Never  Substance and Sexual Activity   Alcohol use: No   Drug use: No   Sexual activity:  Yes    Birth control/protection: Post-menopausal  Other Topics Concern   Not on file  Social History Narrative   Children and grandchildren live with the patient.    Social Drivers of Health   Tobacco Use: Low Risk (12/12/2023)   Patient History    Smoking Tobacco Use: Never    Smokeless Tobacco Use: Never    Passive Exposure: Not on file  Financial Resource Strain: Not on file  Food Insecurity: Not on file  Transportation Needs: Not on file  Physical Activity: Not on file  Stress: Not on file  Social Connections: Not on file  Intimate Partner Violence: Not on file  Depression (EYV7-0): Not on file  Alcohol Screen: Not on file  Housing: Not on file  Utilities: Not on file  Health Literacy: Not on file   No current outpatient medications on file prior to visit.   No current facility-administered medications on file prior to visit.   Allergies  Allergen Reactions   Penicillins Rash   Family History  Problem Relation Age of Onset   Diabetes Mother    Heart attack Mother        Sarcoid   Diabetes Sister    PE: BP 124/68   Pulse 81   Ht 5' 6 (1.676 m)   Wt 198 lb 3.2 oz (89.9 kg)   LMP 01/08/2011   SpO2 98%   BMI 31.99 kg/m  Wt Readings from Last 3 Encounters:  06/17/24 198 lb 3.2 oz (89.9 kg)  12/12/23 189 lb 3.2 oz (85.8 kg)  05/31/23 191 lb 12.8 oz (87 kg)   Constitutional: overweight, in NAD Eyes:  EOMI, no exophthalmos ENT: no neck masses, no cervical lymphadenopathy Cardiovascular: RRR, No MRG Respiratory: CTA B Musculoskeletal: no deformities Skin:no rashes Neurological: no tremor with outstretched hands Diabetic Foot Exam - Simple   Simple Foot Form Diabetic Foot exam was performed with the following findings: Yes 06/17/2024 11:55 AM  Visual Inspection No deformities, no ulcerations, no other skin breakdown bilaterally: Yes Sensation Testing Intact to touch and monofilament testing bilaterally: Yes Pulse Check Posterior Tibialis and Dorsalis  pulse intact bilaterally: Yes Comments    ASSESSMENT: 1. DM2, non-insulin-dependent, controlled, with complications - Atrial fibrillation-diagnosed 01/2023 on [facility] screening. After investigation, she was not considered to have a higher cardiovascular risk but she was referred to endocrinology for diabetic management.  2. HL  PLAN:  1. Patient with history of type 2 diabetes, previously on metformin which was stopped in 2023 due to good control.  She was prescribed a GLP-1 receptor agonist (Ozempic, Rybelsus), but she did not start this.  At last visit, HbA1c was lower, at 6.7% off medication.  She mentions that her blood sugars were higher previously but they started to improve.  Reviewing her blood sugars at home, they were slightly high after dinner, still, up to 190s, and we discussed about working on her dinners to reduce  high glycemic index foods and to include healthy fats.  We also discussed about possibly using metformin and we discussed about the many benefits of the medication.  She was reticent to try this as she mentions that she had higher blood sugars when taking metformin in the past.  We again discussed about restarting a GLP-1 receptor agonist or using an SGLT2 inhibitor but ended up deciding to continue off medication as she was working on her diet. -At today's visit, she is not checking sugars.  She did check some sugars since last visit but sparsely.  They were higher in the target range in the morning and she was not checking later in the day.  We discussed about importance of restarting to check at different times of the day, rotating check times.  I sent in a new glucometer to her pharmacy along with supplies. -We also discussed about improving her diet and advised her to stop honey. -Due to the significantly elevated HbA1c today (see below), I suggested to either restart metformin or try an SGLT2 inhibitor, since her UACR was also elevated.  However, she mentions that metformin  did not help her in the past despite using 3 tablets a day.  Also, she would not want to try another medication for now.  She prefers to start working on her diet.  I would have recommended to start an SGLT2 inhibitor, but advised her to at least let me know if sugars are elevated when she starts checking, and see if we can start them. - I suggested to:  Patient Instructions  Please continue off medication.  Stop honey.  Restart checking sugars 1-2x a day, rotating check times.  I would recommend Jardiance or Farxiga - let me know if you want to them.  Try to go to [facility] or [facility].  Please return in 3 months with your sugar log.   - we checked her HbA1c: 8.3% (higher) - advised to check sugars at different times of the day - 1x a day, rotating check times - advised for yearly eye exams >> she is UTD - At last visit, she was interested in seeing a podiatrist, due to brittle nails.  I recommended to go to [facility] or [facility].  He did not see them yet.  A foot exam was performed at today's visit. - at last visit, her UCR was slightly elevated, at 104.  Will repeat this today.  We may need to start an ARB if this is still elevated. - return to clinic in 6 months  2. HL - Reviewed latest lipid panel from 09/04/2023: LDL above target, otherwise fractions at goal:213/60/70/128  No results found for: CHOL, HDL, LDLCALC, LDLDIRECT, TRIG, CHOLHDL -She was previously on Crestor 5 mg daily without side effects but came off after her diabetes became better controlled.  This was not restarted by Dr. Lavona, especially if her CAC score was 0. - We did discuss that a plant-based diet can greatly help lowering her LDL.  Lela Fendt, MD PhD [facility]

## 2024-06-17 NOTE — Patient Instructions (Addendum)
 Please continue off medication.  Stop honey.  Restart checking sugars 1-2x a day, rotating check times.  I would recommend Jardiance or Farxiga - let me know if you want to them.  Try to go to [facility] or [facility].  Please return in 3 months with your sugar log.

## 2024-06-18 ENCOUNTER — Ambulatory Visit: Payer: Self-pay | Admitting: Internal Medicine

## 2024-06-18 LAB — MICROALBUMIN / CREATININE URINE RATIO
Creatinine, Urine: 214 mg/dL (ref 20–275)
Microalb Creat Ratio: 126 mg/g{creat} — ABNORMAL HIGH
Microalb, Ur: 27 mg/dL

## 2024-12-16 ENCOUNTER — Ambulatory Visit: Admitting: Internal Medicine
